# Patient Record
Sex: Male | Born: 1965 | Race: Black or African American | Hispanic: No | Marital: Married | Smoking: Current every day smoker
Health system: Southern US, Community
[De-identification: ages and names within clinical notes are randomized; demographics above are authoritative.]

## PROBLEM LIST (undated history)

## (undated) DIAGNOSIS — E785 Hyperlipidemia, unspecified: Secondary | ICD-10-CM

## (undated) DIAGNOSIS — M542 Cervicalgia: Secondary | ICD-10-CM

## (undated) DIAGNOSIS — M549 Dorsalgia, unspecified: Secondary | ICD-10-CM

## (undated) DIAGNOSIS — M79606 Pain in leg, unspecified: Secondary | ICD-10-CM

## (undated) DIAGNOSIS — J45909 Unspecified asthma, uncomplicated: Secondary | ICD-10-CM

## (undated) DIAGNOSIS — I10 Essential (primary) hypertension: Secondary | ICD-10-CM

## (undated) DIAGNOSIS — E119 Type 2 diabetes mellitus without complications: Secondary | ICD-10-CM

## (undated) DIAGNOSIS — R200 Anesthesia of skin: Secondary | ICD-10-CM

## (undated) HISTORY — DX: Anesthesia of skin: R20.0

## (undated) HISTORY — PX: CERVICAL SPINE SURGERY: SHX589

## (undated) HISTORY — DX: Cervicalgia: M54.2

## (undated) HISTORY — PX: BACK SURGERY: SHX140

## (undated) HISTORY — PX: WRIST SURGERY: SHX841

## (undated) HISTORY — PX: SHOULDER SURGERY: SHX246

## (undated) HISTORY — DX: Essential (primary) hypertension: I10

## (undated) HISTORY — DX: Type 2 diabetes mellitus without complications: E11.9

## (undated) HISTORY — DX: Unspecified asthma, uncomplicated: J45.909

## (undated) HISTORY — DX: Pain in leg, unspecified: M79.606

## (undated) HISTORY — DX: Dorsalgia, unspecified: M54.9

## (undated) HISTORY — DX: Hyperlipidemia, unspecified: E78.5

---

## 1994-11-23 HISTORY — PX: BACK SURGERY: SHX140

## 2001-09-23 HISTORY — PX: WRIST SURGERY: SHX841

## 2002-08-24 HISTORY — PX: SHOULDER SURGERY: SHX246

## 2009-12-24 HISTORY — PX: CERVICAL SPINE SURGERY: SHX589

## 2016-12-24 HISTORY — PX: NECK SURGERY: SHX720

## 2018-11-10 LAB — BASIC METABOLIC PANEL
BUN: 13 (ref 4–21)
Creatinine: 0.9 (ref 0.6–1.3)

## 2018-11-10 LAB — LIPID PANEL
Cholesterol: 231 — AB (ref 0–200)
HDL: 42 (ref 35–70)
LDL Cholesterol: 140
Triglycerides: 247 — AB (ref 40–160)

## 2018-11-10 LAB — HEMOGLOBIN A1C: Hemoglobin A1C: 10.1

## 2019-01-15 ENCOUNTER — Ambulatory Visit: Payer: Self-pay | Admitting: "Endocrinology

## 2019-02-04 ENCOUNTER — Ambulatory Visit: Payer: BLUE CROSS/BLUE SHIELD | Admitting: "Endocrinology

## 2019-02-04 ENCOUNTER — Encounter: Payer: Self-pay | Admitting: "Endocrinology

## 2019-02-04 VITALS — BP 132/88 | HR 90 | Ht 73.0 in | Wt 270.0 lb

## 2019-02-04 DIAGNOSIS — E782 Mixed hyperlipidemia: Secondary | ICD-10-CM | POA: Diagnosis not present

## 2019-02-04 DIAGNOSIS — E1165 Type 2 diabetes mellitus with hyperglycemia: Secondary | ICD-10-CM

## 2019-02-04 DIAGNOSIS — I10 Essential (primary) hypertension: Secondary | ICD-10-CM

## 2019-02-04 MED ORDER — ACCU-CHEK GUIDE ME W/DEVICE KIT: 1.0000 | PACK | 0 refills | Status: AC

## 2019-02-04 MED ORDER — GLUCOSE BLOOD VI STRP: ORAL_STRIP | 2 refills | Status: AC

## 2019-02-04 MED ORDER — ATORVASTATIN CALCIUM 40 MG PO TABS: 40.0000 mg | ORAL_TABLET | Freq: Every day | ORAL | 3 refills | Status: DC

## 2019-02-04 NOTE — Progress Notes (Signed)
Endocrinology Consult Note       02/04/2019, 6:40 PM   Subjective:    Patient ID: Lee Turner, male    DOB: 06/28/1966.  Lee Turner is being seen in consultation for management of currently uncontrolled symptomatic diabetes requested by  Narda Bonds, FNP.   Past Medical History:  Diagnosis Date  . Diabetes mellitus, type II ([place])   . Hyperlipidemia   . Hypertension    Past Surgical History:  Procedure Laterality Date  . BACK SURGERY    . CERVICAL SPINE SURGERY    . SHOULDER SURGERY    . WRIST SURGERY     Social History   Socioeconomic History  . Marital status: Married    Spouse name: Not on file  . Number of children: Not on file  . Years of education: Not on file  . Highest education level: Not on file  Occupational History  . Not on file  Social Needs  . Financial resource strain: Not on file  . Food insecurity:    Worry: Not on file    Inability: Not on file  . Transportation needs:    Medical: Not on file    Non-medical: Not on file  Tobacco Use  . Smoking status: Current Every Day Smoker    Packs/day: 0.50    Years: 30.00    Pack years: 15.00    Types: Cigarettes  . Smokeless tobacco: Never Used  Substance and Sexual Activity  . Alcohol use: Yes    Comment: socially  . Drug use: Never  . Sexual activity: Not on file  Lifestyle  . Physical activity:    Days per week: Not on file    Minutes per session: Not on file  . Stress: Not on file  Relationships  . Social connections:    Talks on phone: Not on file    Gets together: Not on file    Attends religious service: Not on file    Active member of club or organization: Not on file    Attends meetings of clubs or organizations: Not on file    Relationship status: Not on file  Other Topics Concern  . Not on file  Social History Narrative  . Not on file   Outpatient Encounter Medications as of  02/04/2019  Medication Sig  . desloratadine (CLARINEX) 5 MG tablet Take 5 mg by mouth daily.  . diclofenac (VOLTAREN) 75 MG EC tablet Take 75 mg by mouth 2 (two) times daily.  . fenofibrate (TRICOR) 48 MG tablet Take 48 mg by mouth daily.  . Fluticasone-Salmeterol (ADVAIR) 250-50 MCG/DOSE AEPB Inhale 1 puff into the lungs 2 (two) times daily.  Marland Kitchen HYDROcodone-acetaminophen (NORCO) 7.5-325 MG tablet Take 1 tablet by mouth 2 (two) times daily as needed.  . Ipratropium-Albuterol (COMBIVENT IN) Inhale into the lungs 2 (two) times daily as needed.  . montelukast (SINGULAIR) 10 MG tablet Take 10 mg by mouth at bedtime.  . pregabalin (LYRICA) 100 MG capsule Take 100 mg by mouth 2 (two) times daily.  . SitaGLIPtin-MetFORMIN HCl 50-1000 MG TB24 Take 1 tablet by mouth daily with breakfast.  .  verapamil (CALAN-SR) 240 MG CR tablet Take 240 mg by mouth at bedtime.  Marland Kitchen atorvastatin (LIPITOR) 40 MG tablet Take 1 tablet (40 mg total) by mouth at bedtime.  . Blood Glucose Monitoring Suppl (ACCU-CHEK GUIDE ME) w/Device KIT 1 Piece by Does not apply route as directed.  . cyclobenzaprine (FLEXERIL) 10 MG tablet 2 (two) times daily.  Marland Kitchen glucose blood (ACCU-CHEK GUIDE) test strip Use as instructed   No facility-administered encounter medications on file as of 02/04/2019.     ALLERGIES: No Known Allergies  VACCINATION STATUS:  There is no immunization history on file for this patient.  Diabetes  He presents for his initial diabetic visit. He has type 2 diabetes mellitus. Onset time: He was diagnosed at approximate age of 42 years. His disease course has been worsening. There are no hypoglycemic associated symptoms. Pertinent negatives for hypoglycemia include no confusion, headaches, pallor or seizures. Associated symptoms include polydipsia and polyuria. Pertinent negatives for diabetes include no chest pain, no fatigue, no polyphagia and no weakness. There are no hypoglycemic complications. Symptoms are worsening.  There are no diabetic complications. Risk factors for coronary artery disease include diabetes mellitus, dyslipidemia, family history, male sex, obesity, hypertension, sedentary lifestyle and tobacco exposure. Current diabetic treatment includes oral agent (monotherapy). His weight is increasing steadily. He is following a generally unhealthy diet. When asked about meal planning, he reported none. He has not had a previous visit with a dietitian. He participates in exercise intermittently. (He did not bring any meter nor logs to review, his recent a1c was 10.1% on 11/10/2018.)  Hyperlipidemia  This is a chronic problem. The current episode started more than 1 year ago. The problem is uncontrolled. Pertinent negatives include no chest pain, myalgias or shortness of breath. Current antihyperlipidemic treatment includes fibric acid derivatives. Risk factors for coronary artery disease include diabetes mellitus, dyslipidemia, hypertension, male sex, a sedentary lifestyle, obesity and family history.  Hypertension  This is a chronic problem. The problem is controlled. Pertinent negatives include no chest pain, headaches, neck pain, palpitations or shortness of breath. Past treatments include calcium channel blockers and diuretics.      Review of Systems  Constitutional: Negative for chills, fatigue, fever and unexpected weight change.  HENT: Negative for dental problem, mouth sores and trouble swallowing.   Eyes: Negative for visual disturbance.  Respiratory: Negative for cough, choking, chest tightness, shortness of breath and wheezing.   Cardiovascular: Negative for chest pain, palpitations and leg swelling.  Gastrointestinal: Negative for abdominal distention, abdominal pain, constipation, diarrhea, nausea and vomiting.  Endocrine: Positive for polydipsia and polyuria. Negative for polyphagia.  Genitourinary: Negative for dysuria, flank pain, hematuria and urgency.  Musculoskeletal: Negative for back  pain, gait problem, myalgias and neck pain.  Skin: Negative for pallor, rash and wound.  Neurological: Negative for seizures, syncope, weakness, numbness and headaches.  Psychiatric/Behavioral: Negative for confusion and dysphoric mood.    Objective:    BP 132/88   Pulse 90   Ht '6\' 1"'  (1.854 m)   Wt 270 lb (122.5 kg)   BMI 35.62 kg/m   Wt Readings from Last 3 Encounters:  02/04/19 270 lb (122.5 kg)     Physical Exam Constitutional:      General: He is not in acute distress.    Appearance: He is well-developed.  HENT:     Head: Normocephalic and atraumatic.  Neck:     Musculoskeletal: Normal range of motion and neck supple.     Thyroid: No thyromegaly.  Trachea: No tracheal deviation.  Cardiovascular:     Rate and Rhythm: Normal rate.     Pulses:          Dorsalis pedis pulses are 1+ on the right side and 1+ on the left side.       Posterior tibial pulses are 1+ on the right side and 1+ on the left side.     Heart sounds: Normal heart sounds, S1 normal and S2 normal. No murmur. No gallop.   Pulmonary:     Effort: No respiratory distress.     Breath sounds: Normal breath sounds. No wheezing.  Abdominal:     General: There is no distension.     Tenderness: There is no abdominal tenderness. There is no guarding.  Musculoskeletal:     Right shoulder: He exhibits no swelling and no deformity.  Skin:    General: Skin is warm and dry.     Findings: No rash.     Nails: There is no clubbing.   Neurological:     Mental Status: He is alert and oriented to person, place, and time.     Cranial Nerves: No cranial nerve deficit.     Sensory: No sensory deficit.     Gait: Gait normal.     Deep Tendon Reflexes: Reflexes are normal and symmetric.  Psychiatric:        Speech: Speech normal.        Behavior: Behavior normal. Behavior is cooperative.        Thought Content: Thought content normal.        Judgment: Judgment normal.    Recent Results (from the past 2160  hour(s))  Basic metabolic panel     Status: None   Collection Time: 11/10/18 12:00 AM  Result Value Ref Range   BUN 13 4 - 21   Creatinine 0.9 0.6 - 1.3  Lipid panel     Status: Abnormal   Collection Time: 11/10/18 12:00 AM  Result Value Ref Range   Triglycerides 247 (A) 40 - 160   Cholesterol 231 (A) 0 - 200   HDL 42 35 - 70   LDL Cholesterol 140   Hemoglobin A1c     Status: None   Collection Time: 11/10/18 12:00 AM  Result Value Ref Range   Hemoglobin A1C 10.1      Assessment & Plan:   1. Uncontrolled type 2 diabetes mellitus with hyperglycemia (HCC)   - Lee Turner has currently uncontrolled symptomatic type 2 DM since  53 years of age,  with most recent A1c of 10.1 %. Recent labs reviewed. - I had a long discussion with him about the progressive nature of diabetes and the pathology behind its complications. -his diabetes is complicated by obesity/sedentary life and he remains at a high risk for more acute and chronic complications which include CAD, CVA, CKD, retinopathy, and neuropathy. These are all discussed in detail with him.  - I have counseled him on diet management and weight loss, by adopting a carbohydrate restricted/protein rich diet. - he admits that there is a room for improvement in his food and drink choices. - Suggestion is made for him to avoid simple carbohydrates  from his diet including Cakes, Sweet Desserts, Ice Cream, Soda (diet and regular), Sweet Tea, Candies, Chips, Cookies, Store Bought Juices, Alcohol in Excess of  1-2 drinks a day, Artificial Sweeteners,  Coffee Creamer, and "Sugar-free" Products. This will help patient to have more stable blood glucose profile and potentially  avoid unintended weight gain.  - I encouraged him to switch to  unprocessed or minimally processed complex starch and increased protein intake (animal or plant source), fruits, and vegetables.  - he is advised to stick to a routine mealtimes to eat 3 meals  a day and avoid  unnecessary snacks ( to snack only to correct hypoglycemia).   - he will be scheduled with Jearld Fenton, RDN, CDE for individualized diabetes education.  - I have approached him with the following individualized plan to manage diabetes and patient agrees:   - he will likely require insulin therapy in order for him to achieve and maintain control of diabetes to target. -In preparation he is approached for  strict monitoring of glucose 4 times a day-before meals and at bedtime and present his logs, meter, and his new labs in 1 week.  - he is encouraged to call clinic for blood glucose levels less than 70 or above 300 mg /dl. - he is advised to continue his Janumet at 50/1000mg ER once a day at breakfast, therapeutically suitable for patient .  - Patient specific target  A1c;  LDL, HDL, Triglycerides, and  Waist Circumference were discussed in detail.  2) Blood Pressure /Hypertension:  his blood pressure is  controlled to target.   he is advised to continue his current medications including  Verapamil/HCTZ  p.o. daily with breakfast . 3) Lipids/Hyperlipidemia:   Review of his recent lipid panel showed uncontrolled  LDL at 140 .  he  is advised to continue tricore, but added atorvastatin 31m po qhs.   4)  Weight/Diet:  Body mass index is 35.62 kg/m.  -   clearly complicating his diabetes care.  I discussed with him the fact that loss of 5 - 10% of his  current body weight will have the most impact on his diabetes management.  CDE Consult will be initiated . Exercise, and detailed carbohydrates information provided  -  detailed on discharge instructions.  5) Chronic Care/Health Maintenance:  -he  Is statin medications and  is encouraged to initiate and continue to follow up with Ophthalmology, Dentist,  Podiatrist at least yearly or according to recommendations, and advised to  quit smoking. I have recommended yearly flu vaccine and pneumonia vaccine at least every 5 years; moderate intensity  exercise for up to 150 minutes weekly; and  sleep for at least 7 hours a day.  - he is  advised to maintain close follow up with ANarda Bonds FNP for primary care needs, as well as his other providers for optimal and coordinated care.  - Time spent with the patient: 35 minutes, of which >50% was spent in obtaining information about his symptoms, reviewing his previous labs/studies, evaluations, and treatments, counseling him about his  Uncontrolled T2DM, HPL, HTN , and developing plans for long term treatment based on the latest standards of care/guidelines.    GAzucena Kubaparticipated in the discussions, expressed understanding, and voiced agreement with the above plans.  All questions were answered to his satisfaction. he is encouraged to contact clinic should he have any questions or concerns prior to his return visit.  Follow up plan: - Return in about 1 week (around 02/11/2019), or Must Bring His Meter and Logs next Visit, for Follow up with Pre-visit Labs, Meter, and Logs, Labs Today- Non-Fasting Ok.  Lee Lloyd MD [facility] RW[facility] Hospital[address] StreetRFarmersville [place] [address]Phone: [phone] Fax: [phone]  02/04/2019, 6:40 PM  This note was partially dictated with voice recognition software. Similar sounding words can be transcribed inadequately or may not  be corrected upon review.

## 2019-02-04 NOTE — Patient Instructions (Signed)

## 2019-02-05 LAB — COMPLETE METABOLIC PANEL WITH GFR
AG Ratio: 1.5 (calc) (ref 1.0–2.5)
ALT: 34 U/L (ref 9–46)
AST: 17 U/L (ref 10–35)
Albumin: 4.5 g/dL (ref 3.6–5.1)
Alkaline phosphatase (APISO): 52 U/L (ref 35–144)
BILIRUBIN TOTAL: 0.4 mg/dL (ref 0.2–1.2)
BUN: 13 mg/dL (ref 7–25)
CO2: 25 mmol/L (ref 20–32)
Calcium: 10 mg/dL (ref 8.6–10.3)
Chloride: 102 mmol/L (ref 98–110)
Creat: 0.87 mg/dL (ref 0.70–1.33)
GFR, Est African American: 113 mL/min/{1.73_m2} (ref >=60)
GFR, Est Non African American: 98 mL/min/{1.73_m2} (ref >=60)
Globulin: 3 g/dL (calc) (ref 1.9–3.7)
Glucose, Bld: 113 mg/dL (ref 65–139)
Potassium: 4.6 mmol/L (ref 3.5–5.3)
SODIUM: 136 mmol/L (ref 135–146)
Total Protein: 7.5 g/dL (ref 6.1–8.1)

## 2019-02-05 LAB — HEMOGLOBIN A1C
Hgb A1c MFr Bld: 9.1 % of total Hgb — ABNORMAL HIGH (ref <5.7)
Mean Plasma Glucose: 214 (calc)
eAG (mmol/L): 11.9 (calc)

## 2019-02-05 LAB — MICROALBUMIN / CREATININE URINE RATIO
Creatinine, Urine: 125 mg/dL (ref 20–320)
MICROALB UR: 0.3 mg/dL
Microalb Creat Ratio: 2 mcg/mg creat (ref <30)

## 2019-02-05 LAB — T4, FREE: Free T4: 1.2 ng/dL (ref 0.8–1.8)

## 2019-02-05 LAB — TSH: TSH: 1.11 mIU/L (ref 0.40–4.50)

## 2019-02-05 LAB — VITAMIN D 25 HYDROXY (VIT D DEFICIENCY, FRACTURES): Vit D, 25-Hydroxy: 9 ng/mL — ABNORMAL LOW (ref 30–100)

## 2019-02-13 ENCOUNTER — Ambulatory Visit: Payer: BLUE CROSS/BLUE SHIELD | Admitting: "Endocrinology

## 2019-02-13 ENCOUNTER — Encounter: Payer: Self-pay | Admitting: "Endocrinology

## 2019-02-13 VITALS — BP 125/82 | HR 91 | Ht 73.0 in | Wt 273.0 lb

## 2019-02-13 DIAGNOSIS — E782 Mixed hyperlipidemia: Secondary | ICD-10-CM | POA: Diagnosis not present

## 2019-02-13 DIAGNOSIS — E1165 Type 2 diabetes mellitus with hyperglycemia: Secondary | ICD-10-CM | POA: Diagnosis not present

## 2019-02-13 DIAGNOSIS — I10 Essential (primary) hypertension: Secondary | ICD-10-CM | POA: Diagnosis not present

## 2019-02-13 DIAGNOSIS — E559 Vitamin D deficiency, unspecified: Secondary | ICD-10-CM

## 2019-02-13 MED ORDER — VITAMIN D3 125 MCG (5000 UT) PO CAPS: 5000.0000 [IU] | ORAL_CAPSULE | Freq: Every day | ORAL | 0 refills | Status: DC

## 2019-02-13 NOTE — Progress Notes (Signed)
Endocrinology follow-up  Note       02/13/2019, 11:50 AM   Subjective:    Patient ID: Lee Turner, male    DOB: 10/25/1966.  Azucena Kuba is being seen in follow-up  for management of currently uncontrolled symptomatic diabetes requested by  Narda Bonds, FNP.   Past Medical History:  Diagnosis Date  . Diabetes mellitus, type II ([place])   . Hyperlipidemia   . Hypertension    Past Surgical History:  Procedure Laterality Date  . BACK SURGERY    . CERVICAL SPINE SURGERY    . SHOULDER SURGERY    . WRIST SURGERY     Social History   Socioeconomic History  . Marital status: Married    Spouse name: Not on file  . Number of children: Not on file  . Years of education: Not on file  . Highest education level: Not on file  Occupational History  . Not on file  Social Needs  . Financial resource strain: Not on file  . Food insecurity:    Worry: Not on file    Inability: Not on file  . Transportation needs:    Medical: Not on file    Non-medical: Not on file  Tobacco Use  . Smoking status: Current Every Day Smoker    Packs/day: 0.50    Years: 30.00    Pack years: 15.00    Types: Cigarettes  . Smokeless tobacco: Never Used  Substance and Sexual Activity  . Alcohol use: Yes    Comment: socially  . Drug use: Never  . Sexual activity: Not on file  Lifestyle  . Physical activity:    Days per week: Not on file    Minutes per session: Not on file  . Stress: Not on file  Relationships  . Social connections:    Talks on phone: Not on file    Gets together: Not on file    Attends religious service: Not on file    Active member of club or organization: Not on file    Attends meetings of clubs or organizations: Not on file    Relationship status: Not on file  Other Topics Concern  . Not on file  Social History Narrative  . Not on file   Outpatient Encounter Medications as of  02/13/2019  Medication Sig  . atorvastatin (LIPITOR) 40 MG tablet Take 1 tablet (40 mg total) by mouth at bedtime.  . Blood Glucose Monitoring Suppl (ACCU-CHEK GUIDE ME) w/Device KIT 1 Piece by Does not apply route as directed.  . Cholecalciferol (VITAMIN D3) 125 MCG (5000 UT) CAPS Take 1 capsule (5,000 Units total) by mouth daily.  . cyclobenzaprine (FLEXERIL) 10 MG tablet 2 (two) times daily.  Marland Kitchen desloratadine (CLARINEX) 5 MG tablet Take 5 mg by mouth daily.  . diclofenac (VOLTAREN) 75 MG EC tablet Take 75 mg by mouth 2 (two) times daily.  . fenofibrate (TRICOR) 48 MG tablet Take 48 mg by mouth daily.  . Fluticasone-Salmeterol (ADVAIR) 250-50 MCG/DOSE AEPB Inhale 1 puff into the lungs 2 (two) times daily.  Marland Kitchen glucose blood (ACCU-CHEK GUIDE) test strip Use as  instructed  . HYDROcodone-acetaminophen (NORCO) 7.5-325 MG tablet Take 1 tablet by mouth 2 (two) times daily as needed.  . Ipratropium-Albuterol (COMBIVENT IN) Inhale into the lungs 2 (two) times daily as needed.  . montelukast (SINGULAIR) 10 MG tablet Take 10 mg by mouth at bedtime.  . pregabalin (LYRICA) 100 MG capsule Take 100 mg by mouth 2 (two) times daily.  . SitaGLIPtin-MetFORMIN HCl 50-1000 MG TB24 Take 1 tablet by mouth daily with breakfast.  . verapamil (CALAN-SR) 240 MG CR tablet Take 240 mg by mouth at bedtime.   No facility-administered encounter medications on file as of 02/13/2019.     ALLERGIES: No Known Allergies  VACCINATION STATUS:  There is no immunization history on file for this patient.  Diabetes  He presents for his follow-up diabetic visit. He has type 2 diabetes mellitus. Onset time: He was diagnosed at approximate age of 53 years. His disease course has been improving. There are no hypoglycemic associated symptoms. Pertinent negatives for hypoglycemia include no confusion, headaches, pallor or seizures. Pertinent negatives for diabetes include no chest pain, no fatigue, no polydipsia, no polyphagia, no  polyuria and no weakness. There are no hypoglycemic complications. Symptoms are improving. There are no diabetic complications. Risk factors for coronary artery disease include diabetes mellitus, dyslipidemia, family history, male sex, obesity, hypertension, sedentary lifestyle and tobacco exposure. Current diabetic treatment includes oral agent (monotherapy). His weight is stable. He is following a generally unhealthy diet. When asked about meal planning, he reported none. He has not had a previous visit with a dietitian. He participates in exercise intermittently. His breakfast blood glucose range is generally 130-140 mg/dl. His lunch blood glucose range is generally 110-130 mg/dl. His dinner blood glucose range is generally 110-130 mg/dl. His overall blood glucose range is 110-130 mg/dl. (He returns with significantly improved glycemic profile, averaging 125 over the last 7 days.  His most recent labs show A1c of 9.1% improving from 10.1%.    )  Hyperlipidemia  This is a chronic problem. The current episode started more than 1 year ago. The problem is uncontrolled. Pertinent negatives include no chest pain, myalgias or shortness of breath. Current antihyperlipidemic treatment includes fibric acid derivatives. Risk factors for coronary artery disease include diabetes mellitus, dyslipidemia, hypertension, male sex, a sedentary lifestyle, obesity and family history.  Hypertension  This is a chronic problem. The problem is controlled. Pertinent negatives include no chest pain, headaches, neck pain, palpitations or shortness of breath. Past treatments include calcium channel blockers and diuretics.     Review of Systems  Constitutional: Negative for chills, fatigue, fever and unexpected weight change.  HENT: Negative for dental problem, mouth sores and trouble swallowing.   Eyes: Negative for visual disturbance.  Respiratory: Negative for cough, choking, chest tightness, shortness of breath and wheezing.    Cardiovascular: Negative for chest pain, palpitations and leg swelling.  Gastrointestinal: Negative for abdominal distention, abdominal pain, constipation, diarrhea, nausea and vomiting.  Endocrine: Negative for polydipsia, polyphagia and polyuria.  Genitourinary: Negative for dysuria, flank pain, hematuria and urgency.  Musculoskeletal: Negative for back pain, gait problem, myalgias and neck pain.  Skin: Negative for pallor, rash and wound.  Neurological: Negative for seizures, syncope, weakness, numbness and headaches.  Psychiatric/Behavioral: Negative for confusion and dysphoric mood.    Objective:    BP 125/82   Pulse 91   Ht '6\' 1"'  (1.854 m)   Wt 273 lb (123.8 kg)   BMI 36.02 kg/m   Wt Readings from Last 3 Encounters:  02/13/19 273 lb (123.8 kg)  02/04/19 270 lb (122.5 kg)     Physical Exam Constitutional:      General: He is not in acute distress.    Appearance: He is well-developed.  HENT:     Head: Normocephalic and atraumatic.  Neck:     Musculoskeletal: Normal range of motion and neck supple.     Thyroid: No thyromegaly.     Trachea: No tracheal deviation.  Cardiovascular:     Rate and Rhythm: Normal rate.     Pulses:          Dorsalis pedis pulses are 1+ on the right side and 1+ on the left side.       Posterior tibial pulses are 1+ on the right side and 1+ on the left side.     Heart sounds: Normal heart sounds, S1 normal and S2 normal. No murmur. No gallop.   Pulmonary:     Effort: No respiratory distress.     Breath sounds: Normal breath sounds. No wheezing.  Abdominal:     General: There is no distension.     Tenderness: There is no abdominal tenderness. There is no guarding.  Musculoskeletal:     Right shoulder: He exhibits no swelling and no deformity.  Skin:    General: Skin is warm and dry.     Findings: No rash.     Nails: There is no clubbing.   Neurological:     Mental Status: He is alert and oriented to person, place, and time.      Cranial Nerves: No cranial nerve deficit.     Sensory: No sensory deficit.     Gait: Gait normal.     Deep Tendon Reflexes: Reflexes are normal and symmetric.  Psychiatric:        Speech: Speech normal.        Behavior: Behavior normal. Behavior is cooperative.        Thought Content: Thought content normal.        Judgment: Judgment normal.    Recent Results (from the past 2160 hour(s))  Hemoglobin A1c     Status: Abnormal   Collection Time: 02/04/19  4:28 PM  Result Value Ref Range   Hgb A1c MFr Bld 9.1 (H) <5.7 % of total Hgb    Comment: For someone without known diabetes, a hemoglobin A1c value of 6.5% or greater indicates that they may have  diabetes and this should be confirmed with a follow-up  test. . For someone with known diabetes, a value <7% indicates  that their diabetes is well controlled and a value  greater than or equal to 7% indicates suboptimal  control. A1c targets should be individualized based on  duration of diabetes, age, comorbid conditions, and  other considerations. . Currently, no consensus exists regarding use of hemoglobin A1c for diagnosis of diabetes for children. .    Mean Plasma Glucose 214 (calc)   eAG (mmol/L) 11.9 (calc)  COMPLETE METABOLIC PANEL WITH GFR     Status: None   Collection Time: 02/04/19  4:28 PM  Result Value Ref Range   Glucose, Bld 113 65 - 139 mg/dL    Comment: .        Non-fasting reference interval .    BUN 13 7 - 25 mg/dL   Creat 0.87 0.70 - 1.33 mg/dL    Comment: For patients >16 years of age, the reference limit for Creatinine is approximately 13% higher for people identified as African-American. Marland Kitchen  GFR, Est Non African American 98 > OR = 60 mL/min/1.44m   GFR, Est African American 113 > OR = 60 mL/min/1.786m  BUN/Creatinine Ratio NOT APPLICABLE 6 - 22 (calc)   Sodium 136 135 - 146 mmol/L   Potassium 4.6 3.5 - 5.3 mmol/L   Chloride 102 98 - 110 mmol/L   CO2 25 20 - 32 mmol/L   Calcium 10.0 8.6 - 10.3  mg/dL   Total Protein 7.5 6.1 - 8.1 g/dL   Albumin 4.5 3.6 - 5.1 g/dL   Globulin 3.0 1.9 - 3.7 g/dL (calc)   AG Ratio 1.5 1.0 - 2.5 (calc)   Total Bilirubin 0.4 0.2 - 1.2 mg/dL   Alkaline phosphatase (APISO) 52 35 - 144 U/L   AST 17 10 - 35 U/L   ALT 34 9 - 46 U/L  Microalbumin / creatinine urine ratio     Status: None   Collection Time: 02/04/19  4:28 PM  Result Value Ref Range   Creatinine, Urine 125 20 - 320 mg/dL   Microalb, Ur 0.3 mg/dL    Comment: Reference Range Not established    Microalb Creat Ratio 2 <30 mcg/mg creat    Comment: . The ADA defines abnormalities in albumin excretion as follows: . Marland Kitchenategory         Result (mcg/mg creatinine) . Normal                    <30 Microalbuminuria         30-299  Clinical albuminuria   > OR = 300 . The ADA recommends that at least two of three specimens collected within a 3-6 month period be abnormal before considering a patient to be within a diagnostic category.   TSH     Status: None   Collection Time: 02/04/19  4:28 PM  Result Value Ref Range   TSH 1.11 0.40 - 4.50 mIU/L  T4, free     Status: None   Collection Time: 02/04/19  4:28 PM  Result Value Ref Range   Free T4 1.2 0.8 - 1.8 ng/dL  VITAMIN D 25 Hydroxy (Vit-D Deficiency, Fractures)     Status: Abnormal   Collection Time: 02/04/19  4:28 PM  Result Value Ref Range   Vit D, 25-Hydroxy 9 (L) 30 - 100 ng/mL    Comment: Vitamin D Status         25-OH Vitamin D: . Deficiency:                    <20 ng/mL Insufficiency:             20 - 29 ng/mL Optimal:                 > or = 30 ng/mL . For 25-OH Vitamin D testing on patients on  D2-supplementation and patients for whom quantitation  of D2 and D3 fractions is required, the QuestAssureD(TM) 25-OH VIT D, (D2,D3), LC/MS/MS is recommended: order  code [id number] >2y35yr . For more information on this test, go to: http://education.questdiagnostics.com/faq/FAQ163 (This link is being provided for   informational/educational purposes only.)      Assessment & Plan:   1. Uncontrolled type 2 diabetes mellitus with hyperglycemia (HCC)   - GreAzucena Kubas currently uncontrolled symptomatic type 2 DM since  [age] of age. -His recent labs show A1c of 9.1% improving from 10.1%.  He presents with significantly improved glycemic profile averaging 125 over the last 7  days.  - Recent labs reviewed.  -his diabetes is complicated by obesity/sedentary life and he remains at a high risk for more acute and chronic complications which include CAD, CVA, CKD, retinopathy, and neuropathy. These are all discussed in detail with him.  - I have counseled him on diet management and weight loss, by adopting a carbohydrate restricted/protein rich diet.  - Patient admits there is a room for improvement in his diet and drink choices. -  Suggestion is made for him to avoid simple carbohydrates  from his diet including Cakes, Sweet Desserts / Pastries, Ice Cream, Soda (diet and regular), Sweet Tea, Candies, Chips, Cookies, Store Bought Juices, Alcohol in Excess of  1-2 drinks a day, Artificial Sweeteners, and "Sugar-free" Products. This will help patient to have stable blood glucose profile and potentially avoid unintended weight gain.   - I encouraged him to switch to  unprocessed or minimally processed complex starch and increased protein intake (animal or plant source), fruits, and vegetables.  - he is advised to stick to a routine mealtimes to eat 3 meals  a day and avoid unnecessary snacks ( to snack only to correct hypoglycemia).   - he will be scheduled with Jearld Fenton, RDN, CDE for individualized diabetes education.  - I have approached him with the following individualized plan to manage diabetes and patient agrees:   -Based on his presentation with near target glycemia, he will not require insulin treatment at this time. -He is advised to continue Janumet 50/1000 mg ER once a day at  breakfast, continue to monitor blood glucose at least one time a day before breakfast.  - Patient specific target  A1c;  LDL, HDL, Triglycerides, and  Waist Circumference were discussed in detail.  2) Blood Pressure /Hypertension:  his blood pressure is controlled to target.    he is advised to continue his current medications including  Verapamil/HCTZ  p.o. daily with breakfast .   3) Lipids/Hyperlipidemia:   Review of his recent lipid panel showed uncontrolled  LDL at 140 .  he  is advised to continue tricore, but added atorvastatin 30m po qhs.   4)  Weight/Diet:  Body mass index is 36.02 kg/m.  -   clearly complicating his diabetes care.  I discussed with him the fact that loss of 5 - 10% of his  current body weight will have the most impact on his diabetes management.  CDE Consult will be initiated . Exercise, and detailed carbohydrates information provided  -  detailed on discharge instructions.  5) Chronic Care/Health Maintenance:  -he  Is statin medications and  is encouraged to initiate and continue to follow up with Ophthalmology, Dentist,  Podiatrist at least yearly or according to recommendations, and advised to  quit smoking. I have recommended yearly flu vaccine and pneumonia vaccine at least every 5 years; moderate intensity exercise for up to 150 minutes weekly; and  sleep for at least 7 hours a day.  - he is  advised to maintain close follow up with ANarda Bonds FNP for primary care needs, as well as his other providers for optimal and coordinated care.  - Time spent with the patient: 25 min, of which >50% was spent in reviewing his blood glucose logs , discussing his hypoglycemia and hyperglycemia episodes, reviewing his current and  previous labs / studies and medications  doses and developing a plan to avoid hypoglycemia and hyperglycemia. Please refer to Patient Instructions for Blood Glucose Monitoring and Insulin/Medications Dosing Guide"  in media tab for  additional information. Azucena Kuba participated in the discussions, expressed understanding, and voiced agreement with the above plans.  All questions were answered to his satisfaction. he is encouraged to contact clinic should he have any questions or concerns prior to his return visit.   Follow up plan: - Return in about 4 months (around 06/14/2019) for Follow up with Pre-visit Labs, Meter, and Logs.  Glade Lloyd, MD [facility] [address] [place], [place] [address] Phone: [phone]  Fax: [phone]    02/13/2019, 11:50 AM  This note was partially dictated with voice recognition software. Similar sounding words can be transcribed inadequately or may not  be corrected upon review.

## 2019-02-13 NOTE — Patient Instructions (Signed)

## 2019-03-11 ENCOUNTER — Ambulatory Visit: Payer: Self-pay | Admitting: Nutrition

## 2019-06-09 ENCOUNTER — Other Ambulatory Visit: Payer: Self-pay | Admitting: "Endocrinology

## 2019-06-17 ENCOUNTER — Other Ambulatory Visit: Payer: Self-pay

## 2019-06-17 ENCOUNTER — Ambulatory Visit: Payer: BLUE CROSS/BLUE SHIELD | Admitting: "Endocrinology

## 2019-10-05 ENCOUNTER — Other Ambulatory Visit: Payer: Self-pay | Admitting: "Endocrinology

## 2020-01-31 ENCOUNTER — Other Ambulatory Visit: Payer: Self-pay | Admitting: "Endocrinology

## 2020-02-22 HISTORY — PX: SHOULDER SURGERY: SHX246

## 2020-06-01 ENCOUNTER — Encounter: Payer: Self-pay | Admitting: "Endocrinology

## 2020-07-28 ENCOUNTER — Other Ambulatory Visit: Payer: Self-pay | Admitting: "Endocrinology

## 2020-11-01 ENCOUNTER — Other Ambulatory Visit: Payer: Self-pay

## 2020-11-01 DIAGNOSIS — I10 Essential (primary) hypertension: Secondary | ICD-10-CM

## 2020-11-01 DIAGNOSIS — E1165 Type 2 diabetes mellitus with hyperglycemia: Secondary | ICD-10-CM

## 2020-11-01 DIAGNOSIS — E782 Mixed hyperlipidemia: Secondary | ICD-10-CM

## 2020-11-01 DIAGNOSIS — E559 Vitamin D deficiency, unspecified: Secondary | ICD-10-CM

## 2022-01-24 DIAGNOSIS — Z0289 Encounter for other administrative examinations: Secondary | ICD-10-CM

## 2022-02-08 ENCOUNTER — Ambulatory Visit (INDEPENDENT_AMBULATORY_CARE_PROVIDER_SITE_OTHER): Payer: BC Managed Care – PPO | Admitting: Family Medicine

## 2022-02-08 ENCOUNTER — Other Ambulatory Visit: Payer: Self-pay

## 2022-02-08 ENCOUNTER — Encounter (INDEPENDENT_AMBULATORY_CARE_PROVIDER_SITE_OTHER): Payer: Self-pay | Admitting: Family Medicine

## 2022-02-08 VITALS — BP 129/81 | HR 108 | Temp 98.1°F | Ht 71.0 in | Wt 257.0 lb

## 2022-02-08 DIAGNOSIS — I152 Hypertension secondary to endocrine disorders: Secondary | ICD-10-CM

## 2022-02-08 DIAGNOSIS — E1159 Type 2 diabetes mellitus with other circulatory complications: Secondary | ICD-10-CM

## 2022-02-08 DIAGNOSIS — E559 Vitamin D deficiency, unspecified: Secondary | ICD-10-CM

## 2022-02-08 DIAGNOSIS — E785 Hyperlipidemia, unspecified: Secondary | ICD-10-CM

## 2022-02-08 DIAGNOSIS — E1169 Type 2 diabetes mellitus with other specified complication: Secondary | ICD-10-CM

## 2022-02-08 DIAGNOSIS — R5383 Other fatigue: Secondary | ICD-10-CM | POA: Diagnosis not present

## 2022-02-08 DIAGNOSIS — E66812 Obesity, class 2: Secondary | ICD-10-CM

## 2022-02-08 DIAGNOSIS — E1165 Type 2 diabetes mellitus with hyperglycemia: Secondary | ICD-10-CM | POA: Diagnosis not present

## 2022-02-08 DIAGNOSIS — Z6835 Body mass index (BMI) 35.0-35.9, adult: Secondary | ICD-10-CM

## 2022-02-08 DIAGNOSIS — R0602 Shortness of breath: Secondary | ICD-10-CM

## 2022-02-08 DIAGNOSIS — Z7985 Long-term (current) use of injectable non-insulin antidiabetic drugs: Secondary | ICD-10-CM

## 2022-02-08 DIAGNOSIS — Z1331 Encounter for screening for depression: Secondary | ICD-10-CM

## 2022-02-08 DIAGNOSIS — Z720 Tobacco use: Secondary | ICD-10-CM

## 2022-02-08 DIAGNOSIS — E669 Obesity, unspecified: Secondary | ICD-10-CM

## 2022-02-08 MED ORDER — OZEMPIC (0.25 OR 0.5 MG/DOSE) 2 MG/1.5ML ~~LOC~~ SOPN: 0.2500 mg | PEN_INJECTOR | SUBCUTANEOUS | 0 refills | Status: DC

## 2022-02-09 LAB — COMPREHENSIVE METABOLIC PANEL
ALT: 45 IU/L — ABNORMAL HIGH (ref 0–44)
AST: 28 IU/L (ref 0–40)
Albumin/Globulin Ratio: 1.8 (ref 1.2–2.2)
Albumin: 4.5 g/dL (ref 3.8–4.9)
Alkaline Phosphatase: 54 IU/L (ref 44–121)
BUN/Creatinine Ratio: 11 (ref 9–20)
BUN: 10 mg/dL (ref 6–24)
Bilirubin Total: 0.2 mg/dL (ref 0.0–1.2)
CO2: 23 mmol/L (ref 20–29)
Calcium: 9.9 mg/dL (ref 8.7–10.2)
Chloride: 98 mmol/L (ref 96–106)
Creatinine, Ser: 0.89 mg/dL (ref 0.76–1.27)
Globulin, Total: 2.5 g/dL (ref 1.5–4.5)
Glucose: 121 mg/dL — ABNORMAL HIGH (ref 70–99)
Potassium: 4.7 mmol/L (ref 3.5–5.2)
Sodium: 137 mmol/L (ref 134–144)
Total Protein: 7 g/dL (ref 6.0–8.5)
eGFR: 100 mL/min/{1.73_m2} (ref >59)

## 2022-02-09 LAB — CBC WITH DIFFERENTIAL/PLATELET
Basophils Absolute: 0 10*3/uL (ref 0.0–0.2)
Basos: 1 %
EOS (ABSOLUTE): 0.2 10*3/uL (ref 0.0–0.4)
Eos: 3 %
Hematocrit: 42.1 % (ref 37.5–51.0)
Hemoglobin: 14.5 g/dL (ref 13.0–17.7)
Immature Grans (Abs): 0 10*3/uL (ref 0.0–0.1)
Immature Granulocytes: 0 %
Lymphocytes Absolute: 1.9 10*3/uL (ref 0.7–3.1)
Lymphs: 40 %
MCH: 31.5 pg (ref 26.6–33.0)
MCHC: 34.4 g/dL (ref 31.5–35.7)
MCV: 92 fL (ref 79–97)
Monocytes Absolute: 0.6 10*3/uL (ref 0.1–0.9)
Monocytes: 13 %
Neutrophils Absolute: 2 10*3/uL (ref 1.4–7.0)
Neutrophils: 43 %
Platelets: 244 10*3/uL (ref 150–450)
RBC: 4.6 x10E6/uL (ref 4.14–5.80)
RDW: 13 % (ref 11.6–15.4)
WBC: 4.7 10*3/uL (ref 3.4–10.8)

## 2022-02-09 LAB — TSH: TSH: 1.2 u[IU]/mL (ref 0.450–4.500)

## 2022-02-09 LAB — T4, FREE: Free T4: 1 ng/dL (ref 0.82–1.77)

## 2022-02-09 LAB — VITAMIN B12: Vitamin B-12: 793 pg/mL (ref 232–1245)

## 2022-02-09 LAB — LIPID PANEL WITH LDL/HDL RATIO
Cholesterol, Total: 137 mg/dL (ref 100–199)
HDL: 45 mg/dL (ref >39)
LDL Chol Calc (NIH): 74 mg/dL (ref 0–99)
LDL/HDL Ratio: 1.6 ratio (ref 0.0–3.6)
Triglycerides: 99 mg/dL (ref 0–149)
VLDL Cholesterol Cal: 18 mg/dL (ref 5–40)

## 2022-02-09 LAB — HEMOGLOBIN A1C
Est. average glucose Bld gHb Est-mCnc: 194 mg/dL
Hgb A1c MFr Bld: 8.4 % — ABNORMAL HIGH (ref 4.8–5.6)

## 2022-02-09 LAB — VITAMIN D 25 HYDROXY (VIT D DEFICIENCY, FRACTURES): Vit D, 25-Hydroxy: 22.7 ng/mL — ABNORMAL LOW (ref 30.0–100.0)

## 2022-02-09 LAB — INSULIN, RANDOM: INSULIN: 18.6 u[IU]/mL (ref 2.6–24.9)

## 2022-02-09 LAB — T3: T3, Total: 107 ng/dL (ref 71–180)

## 2022-02-09 LAB — FOLATE: Folate: 11 ng/mL (ref >3.0)

## 2022-02-12 ENCOUNTER — Encounter (INDEPENDENT_AMBULATORY_CARE_PROVIDER_SITE_OTHER): Payer: Self-pay

## 2022-02-12 ENCOUNTER — Telehealth (INDEPENDENT_AMBULATORY_CARE_PROVIDER_SITE_OTHER): Payer: Self-pay | Admitting: Family Medicine

## 2022-02-12 NOTE — Telephone Encounter (Signed)
Patient called requesting a Prior Authorization for the Ozempic that Dr. Jeani Sow prescribed. Pt stated that the medication will not be filled by the pharmacy without a PA. Patient would like call back at [phone].

## 2022-02-12 NOTE — Telephone Encounter (Signed)
Patient sent mychart message this morning prior authorization for Ozempic approved.

## 2022-02-12 NOTE — Telephone Encounter (Signed)
Spoke w/ pt, made aware of approval-CAS

## 2022-02-12 NOTE — Progress Notes (Signed)
Dear Dr. Rolena Infante,   Thank you for referring Lee Turner to our clinic. The following note includes my evaluation and treatment recommendations.  Chief Complaint:   OBESITY Lee Turner (MR# [id number]) is a 56 y.o. male who presents for evaluation and treatment of obesity and related comorbidities. Current BMI is Body mass index is 35.84 kg/m. Lee Turner has been struggling with his weight for many years and has been unsuccessful in either losing weight, maintaining weight loss, or reaching his healthy weight goal.  Lee Turner is currently in the action stage of change and ready to dedicate time achieving and maintaining a healthier weight. Lee Turner is interested in becoming our patient and working on intensive lifestyle modifications including (but not limited to) diet and exercise for weight loss.  Referred by Dr. Rolena Infante. [name] previously tried Orvan Seen. He skips 1 meal a day. He may get something for breakfast- 2 biscuits from [employer] or [employer]- bacon, egg, and cheese or country ham biscuit (satisfied). Dinner would be [place] food- beef, chicken, shrimp, rice, egg drop soup, and fried wontons. It could be doable for pt to cook at home.  [name]'s habits were reviewed today and are as follows: His family eats meals together, he thinks his family will eat healthier with him, his desired weight loss is 8 lbs or below 250 lbs, he has been heavy most of his life, he started gaining weight after having back surgery, his heaviest weight ever was 335 pounds, he has significant food cravings issues, he snacks frequently in the evenings, he skips meals frequently, he is frequently drinking liquids with calories, and he frequently makes poor food choices.  Depression Screen Lee Turner's Food and Mood (modified PHQ-9) score was 0.  Depression screen [facility] 2/9 02/08/2022  Decreased Interest 0  Down, Depressed, Hopeless 0  PHQ - 2 Score 0  Altered sleeping 0  Tired, decreased energy 0  Change in  appetite 0  Feeling bad or failure about yourself  0  Trouble concentrating 0  Moving slowly or fidgety/restless 0  Suicidal thoughts 0  PHQ-9 Score 0  Difficult doing work/chores Not difficult at all   Subjective:   1. Other fatigue Lee Turner admits to daytime somnolence and  occasionally  waking up still tired but normally refreshed. Patient has a history of symptoms of daytime fatigue and morning fatigue. [name] generally gets 5 or 6 hours of sleep per night, and states that he has generally restful sleep. Snoring is present. Apneic episodes are not present. Epworth Sleepiness Score is 8. EKG normal sinus rhythm at 85 bpm.  2. SOBOE (shortness of breath on exertion) [name] notes increasing shortness of breath with exercising and seems to be worsening over time with weight gain. He notes getting out of breath sooner with activity than he used to. This has not gotten worse recently. [name] denies shortness of breath at rest or orthopnea.  3. Type 2 diabetes mellitus with hyperglycemia, without long-term current use of insulin ([place]) Dx'd 2003. [name]'s last A1c was 9.9 in September 2022. He is on Janumet XR 50/100.  4. Hypertension associated with diabetes ([name]) Dx'd more than 20 years ago. BP controlled today and EKG within normal limits. Pt is Rx'd Verapamil.  5. Hyperlipidemia associated with type 2 diabetes mellitus ([place]) His last LDL was 73, and he is Rx'd Tricor and Lipitor.  6. Vitamin D deficiency Pt is not on Vit D supplementation. His last Vit D level was 24.5.  7. Tobacco abuse [name] has smoked  1/2 ppd for 5 years. He does not desire cessation.  Assessment/Plan:   1. Other fatigue [name] does not feel that his weight is causing his energy to be lower than it should be. Fatigue may be related to obesity, depression or many other causes. Labs will be ordered, and in the meanwhile, Lee Turner will focus on self care including making healthy food choices, increasing  physical activity and focusing on stress reduction. Check labs today.  - EKG 12-Lead - Folate - T3 - T4, free - TSH  2. SOBOE (shortness of breath on exertion) Lee Turner does not feel that he gets out of breath more easily that he used to when he exercises. Lee Turner's shortness of breath appears to be obesity related and exercise induced. He has agreed to work on weight loss and gradually increase exercise to treat his exercise induced shortness of breath. Will continue to monitor closely. Check labs today.  - CBC with Differential/Platelet  3. Type 2 diabetes mellitus with hyperglycemia, without long-term current use of insulin (HCC) Good blood sugar control is important to decrease the likelihood of diabetic complications such as nephropathy, neuropathy, limb loss, blindness, coronary artery disease, and death. Intensive lifestyle modification including diet, exercise and weight loss are the first line of treatment for diabetes. Check labs today. Lee Turner will start Ozempic 0.25 mg weekly as Rx'd.  - Vitamin B12 - Hemoglobin A1c - Insulin, random  Start- Semaglutide,0.25 or 0.5MG /DOS, (OZEMPIC, 0.25 OR 0.5 MG/DOSE,) 2 MG/1.5ML SOPN; Inject 0.25 mg into the skin once a week.  Dispense: 1.5 mL; Refill: 0  4. Hypertension associated with diabetes ([place]) Lee Turner is working on healthy weight loss and exercise to improve blood pressure control. We will watch for signs of hypotension as he continues his lifestyle modifications. Check labs today.  - Comprehensive metabolic panel  5. Hyperlipidemia associated with type 2 diabetes mellitus ([place]) Cardiovascular risk and specific lipid/LDL goals reviewed.  We discussed several lifestyle modifications today and Lee Turner will continue to work on diet, exercise and weight loss efforts. Orders and follow up as documented in patient record.   Counseling Intensive lifestyle modifications are the first line treatment for this issue. Dietary changes:  Increase soluble fiber. Decrease simple carbohydrates. Exercise changes: Moderate to vigorous-intensity aerobic activity 150 minutes per week if tolerated. Lipid-lowering medications: see documented in medical record. Check labs today.  - Lipid Panel With LDL/HDL Ratio  6. Vitamin D deficiency Low Vitamin D level contributes to fatigue and are associated with obesity, breast, and colon cancer. He will follow-up for routine testing of Vitamin D, at least 2-3 times per year to avoid over-replacement. Check labs today.  - VITAMIN D 25 Hydroxy (Vit-D Deficiency, Fractures)  7. Tobacco abuse Pt is to inform provider when he desires cessation.  8. Depression screening [place] had a negative depression screening. Depression is commonly associated with obesity and often results in emotional eating behaviors. We will monitor this closely and work on CBT to help improve the non-hunger eating patterns. Referral to Psychology may be required if no improvement is seen as he continues in our clinic.  9. Obesity with current BMI of 35.8 Nathias is currently in the action stage of change and his goal is to continue with weight loss efforts. I recommend Semajay begin the structured treatment plan as follows:  He has agreed to the Category 4 Plan.  Exercise goals: No exercise has been prescribed at this time.   Behavioral modification strategies: increasing lean protein intake, decreasing eating out, no  skipping meals, meal planning and cooking strategies, keeping healthy foods in the home, and planning for success.  He was informed of the importance of frequent follow-up visits to maximize his success with intensive lifestyle modifications for his multiple health conditions. He was informed we would discuss his lab results at his next visit unless there is a critical issue that needs to be addressed sooner. Keiondre agreed to keep his next visit at the agreed upon time to discuss these  results.  Objective:   Blood pressure 129/81, pulse (!) 108, temperature 98.1 F (36.7 C), height 5\' 11"  (1.803 m), weight 257 lb (116.6 kg), SpO2 97 %. Body mass index is 35.84 kg/m.  EKG: Normal sinus rhythm, rate 85.  Indirect Calorimeter completed today shows a VO2 of 323 and a REE of 2232.  His calculated basal metabolic rate is [id number] thus his basal metabolic rate is worse than expected.  General: Cooperative, alert, well developed, in no acute distress. HEENT: Conjunctivae and lids unremarkable. Cardiovascular: Regular rhythm.  Lungs: Normal work of breathing. Neurologic: No focal deficits.   Lab Results  Component Value Date   CREATININE 0.89 02/08/2022   BUN 10 02/08/2022   NA 137 02/08/2022   K 4.7 02/08/2022   CL 98 02/08/2022   CO2 23 02/08/2022   Lab Results  Component Value Date   ALT 45 (H) 02/08/2022   AST 28 02/08/2022   ALKPHOS 54 02/08/2022   BILITOT 0.2 02/08/2022   Lab Results  Component Value Date   HGBA1C 8.4 (H) 02/08/2022   HGBA1C 9.1 (H) 02/04/2019   HGBA1C 10.1 11/10/2018   Lab Results  Component Value Date   INSULIN 18.6 02/08/2022   Lab Results  Component Value Date   TSH 1.200 02/08/2022   Lab Results  Component Value Date   CHOL 137 02/08/2022   HDL 45 02/08/2022   LDLCALC 74 02/08/2022   TRIG 99 02/08/2022   Lab Results  Component Value Date   WBC 4.7 02/08/2022   HGB 14.5 02/08/2022   HCT 42.1 02/08/2022   MCV 92 02/08/2022   PLT 244 02/08/2022    Attestation Statements:   Reviewed by clinician on day of visit: allergies, medications, problem list, medical history, surgical history, family history, social history, and previous encounter notes.  Time spent on visit including pre-visit chart review and post-visit charting and care was 60 minutes.   Coral Ceo, CMA, am acting as transcriptionist for Coralie Common, MD.   This is the patient's first visit at [facility]. The patient's NEW  PATIENT PACKET was reviewed at length. Included in the packet: current and past health history, medications, allergies, ROS, gynecologic history (women only), surgical history, family history, social history, weight history, weight loss surgery history (for those that have had weight loss surgery), nutritional evaluation, mood and food questionnaire, PHQ9, Epworth questionnaire, sleep habits questionnaire, patient life and health improvement goals questionnaire. These will all be scanned into the patient's chart under media.   During the visit, I independently reviewed the patient's EKG, bioimpedance scale results, and indirect calorimeter results. I used this information to tailor a meal plan for the patient that will help him to lose weight and will improve his obesity-related conditions going forward. I performed a medically necessary appropriate examination and/or evaluation. I discussed the assessment and treatment plan with the patient. The patient was provided an opportunity to ask questions and all were answered. The patient agreed with the plan and demonstrated an understanding  of the instructions. Labs were ordered at this visit and will be reviewed at the next visit unless more critical results need to be addressed immediately. Clinical information was updated and documented in the EMR.   Time spent on visit including pre-visit chart review and post-visit care was 67 minutes.   I have reviewed the above documentation for accuracy and completeness, and I agree with the above. - Coralie Common, MD

## 2022-02-12 NOTE — Telephone Encounter (Signed)
Please check PA, Thanks

## 2022-02-27 ENCOUNTER — Ambulatory Visit (INDEPENDENT_AMBULATORY_CARE_PROVIDER_SITE_OTHER): Payer: BC Managed Care – PPO | Admitting: Family Medicine

## 2022-02-27 ENCOUNTER — Encounter (INDEPENDENT_AMBULATORY_CARE_PROVIDER_SITE_OTHER): Payer: Self-pay

## 2022-03-14 ENCOUNTER — Ambulatory Visit (INDEPENDENT_AMBULATORY_CARE_PROVIDER_SITE_OTHER): Payer: BC Managed Care – PPO | Admitting: Family Medicine

## 2022-03-14 ENCOUNTER — Other Ambulatory Visit: Payer: Self-pay

## 2022-03-14 ENCOUNTER — Encounter (INDEPENDENT_AMBULATORY_CARE_PROVIDER_SITE_OTHER): Payer: Self-pay | Admitting: Family Medicine

## 2022-03-14 VITALS — BP 129/76 | HR 78 | Temp 97.7°F | Ht 71.0 in | Wt 258.0 lb

## 2022-03-14 DIAGNOSIS — E1159 Type 2 diabetes mellitus with other circulatory complications: Secondary | ICD-10-CM

## 2022-03-14 DIAGNOSIS — E1165 Type 2 diabetes mellitus with hyperglycemia: Secondary | ICD-10-CM

## 2022-03-14 DIAGNOSIS — Z6836 Body mass index (BMI) 36.0-36.9, adult: Secondary | ICD-10-CM

## 2022-03-14 DIAGNOSIS — E1169 Type 2 diabetes mellitus with other specified complication: Secondary | ICD-10-CM | POA: Diagnosis not present

## 2022-03-14 DIAGNOSIS — E559 Vitamin D deficiency, unspecified: Secondary | ICD-10-CM

## 2022-03-14 DIAGNOSIS — I152 Hypertension secondary to endocrine disorders: Secondary | ICD-10-CM | POA: Diagnosis not present

## 2022-03-14 DIAGNOSIS — Z7985 Long-term (current) use of injectable non-insulin antidiabetic drugs: Secondary | ICD-10-CM

## 2022-03-14 DIAGNOSIS — E669 Obesity, unspecified: Secondary | ICD-10-CM

## 2022-03-14 DIAGNOSIS — E785 Hyperlipidemia, unspecified: Secondary | ICD-10-CM

## 2022-03-14 MED ORDER — VITAMIN D (ERGOCALCIFEROL) 1.25 MG (50000 UNIT) PO CAPS: 50000.0000 [IU] | ORAL_CAPSULE | ORAL | 0 refills | Status: DC

## 2022-03-16 NOTE — Progress Notes (Signed)
? ? ? ?Chief Complaint:  ? ?OBESITY ?Lee Turner is here to discuss his progress with his obesity treatment plan along with follow-up of his obesity related diagnoses. Lee Turner is on the Category 4 Plan and states he is following his eating plan approximately 50% of the time. Lee Turner states he is using his treadmill and swimming in pool 30 minutes 2 times per week. ? ?Today's visit was #: 2 ?Starting weight: 257 lbs ?Starting date: 02/08/2022 ?Today's weight: 258 lbs ?Today's date: 03/14/2022 ?Total lbs lost to date: 0 ?Total lbs lost since last in-office visit: 0 ? ?Interim History: Lee Turner hasn't really been able to be home as much as he is going to PT. Eating on the go more frequently. Getting home at 10:30/11pm and getting up at 4:30am.  He would like to get more on trade. It took 2-3 weeks to get Ozempic, also he is concerned about availability of Ozempic, (he has had 2 doses so far). Maybe 2x/week does he eat 3 meals. ? ?Subjective:  ? ?1. Vitamin D deficiency ?Lee Turner is not on any Vitamin D replacements and notes fatigue. His vitamin D level is 22.7. ? ?2. 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  His LDL 74, HDL 45 and Trig 99. No myalgias or transaminitis. ? ?3. Type 2 diabetes mellitus with hyperglycemia, without long-term current use of insulin (HCC) ?Lee Turner A1C at 8.4, (previously 9.1) diagnosed in 2003/2004. His insulin level  was 18.6 and he is on Ozempic, Sitagliptan-Metformin, with no feelings of hypoglycemia. ? ?4. Hypertension associated with diabetes (HCC) ?Lee Turner's blood pressure is controlled today with no chest pain/chest pressure or headache. He is taking Verapamil. ? ?Assessment/Plan:  ? ?1. Vitamin D deficiency ?Lee Turner agreed to start Vit D 50K IU weekly with no refills. ? ?- Refill Vitamin D, Ergocalciferol, (DRISDOL) 1.25 MG (50000 UNIT) CAPS capsule; Take 1 capsule (50,000 Units total) by mouth every 7 (seven) days.  Dispense: 4  capsule; Refill: 0 ? ?2. Hyperlipidemia associated with type 2 diabetes mellitus (HCC) ?Lee Turner is to continue his current medications with no change in dosage. ? ?3. Type 2 diabetes mellitus with hyperglycemia, without long-term current use of insulin (HCC) ?Lee Turner is to continue on Ozempic+Sitagliptan-Metformin. ? ?4. Hypertension associated with diabetes (HCC) ?Lee Turner will continue Verapamil with no change in dosage. ? ?5. Obesity with current BMI of 36.1 ?Lee Turner is currently in the action stage of change. As such, his goal is to continue with weight loss efforts. He has agreed to the Category 4 Plan.  ? ?Exercise goals: As is. ? ?Behavioral modification strategies: increasing lean protein intake, meal planning and cooking strategies, keeping healthy foods in the home, and planning for success. ? ?Lee Turner has agreed to follow-up with our clinic in 3 weeks. He was informed of the importance of frequent follow-up visits to maximize his success with intensive lifestyle modifications for his multiple health conditions.  ? ?Objective:  ? ?Blood pressure 129/76, pulse 78, temperature 97.7 ?F (36.5 ?C), height 5\' 11"  (1.803 m), weight 258 lb (117 kg), SpO2 99 %. ?Body mass index is 35.98 kg/m?. ? ?General: Cooperative, alert, well developed, in no acute distress. ?HEENT: Conjunctivae and lids unremarkable. ?Cardiovascular: Regular rhythm.  ?Lungs: Normal work of breathing. ?Neurologic: No focal deficits.  ? ?Lab Results  ?Component Value Date  ? CREATININE 0.89 02/08/2022  ? BUN 10 02/08/2022  ? NA 137 02/08/2022  ? K 4.7 02/08/2022  ? CL 98 02/08/2022  ? CO2 23  02/08/2022  ? ?Lab Results  ?Component Value Date  ? ALT 45 (H) 02/08/2022  ? AST 28 02/08/2022  ? ALKPHOS 54 02/08/2022  ? BILITOT 0.2 02/08/2022  ? ?Lab Results  ?Component Value Date  ? HGBA1C 8.4 (H) 02/08/2022  ? HGBA1C 9.1 (H) 02/04/2019  ? HGBA1C 10.1 11/10/2018  ? ?Lab Results  ?Component Value Date  ? INSULIN 18.6 02/08/2022  ? ?Lab Results   ?Component Value Date  ? TSH 1.200 02/08/2022  ? ?Lab Results  ?Component Value Date  ? CHOL 137 02/08/2022  ? HDL 45 02/08/2022  ? LDLCALC 74 02/08/2022  ? TRIG 99 02/08/2022  ? ?Lab Results  ?Component Value Date  ? VD25OH 22.7 (L) 02/08/2022  ? VD25OH 9 (L) 02/04/2019  ? ?Lab Results  ?Component Value Date  ? WBC 4.7 02/08/2022  ? HGB 14.5 02/08/2022  ? HCT 42.1 02/08/2022  ? MCV 92 02/08/2022  ? PLT 244 02/08/2022  ? ?No results found for: IRON, TIBC, FERRITIN ? ?Attestation Statements:  ? ?Reviewed by clinician on day of visit: allergies, medications, problem list, medical history, surgical history, family history, social history, and previous encounter notes. ? ?I, Lee Turner, am acting as transcriptionist for Reuben Likes, MD ? ?I have reviewed the above documentation for accuracy and completeness, and I agree with the above. Reuben Likes, MD ? ?

## 2022-03-21 ENCOUNTER — Other Ambulatory Visit (INDEPENDENT_AMBULATORY_CARE_PROVIDER_SITE_OTHER): Payer: Self-pay | Admitting: Family Medicine

## 2022-03-21 DIAGNOSIS — E559 Vitamin D deficiency, unspecified: Secondary | ICD-10-CM

## 2022-03-22 ENCOUNTER — Ambulatory Visit (INDEPENDENT_AMBULATORY_CARE_PROVIDER_SITE_OTHER): Payer: BC Managed Care – PPO | Admitting: Family Medicine

## 2022-03-22 ENCOUNTER — Encounter (INDEPENDENT_AMBULATORY_CARE_PROVIDER_SITE_OTHER): Payer: Self-pay

## 2022-04-04 ENCOUNTER — Ambulatory Visit (INDEPENDENT_AMBULATORY_CARE_PROVIDER_SITE_OTHER): Payer: BC Managed Care – PPO | Admitting: Family Medicine

## 2022-04-04 ENCOUNTER — Encounter (INDEPENDENT_AMBULATORY_CARE_PROVIDER_SITE_OTHER): Payer: Self-pay | Admitting: Family Medicine

## 2022-04-04 VITALS — BP 113/74 | HR 98 | Temp 98.4°F | Ht 71.0 in | Wt 253.0 lb

## 2022-04-04 DIAGNOSIS — E669 Obesity, unspecified: Secondary | ICD-10-CM

## 2022-04-04 DIAGNOSIS — Z6835 Body mass index (BMI) 35.0-35.9, adult: Secondary | ICD-10-CM

## 2022-04-04 DIAGNOSIS — E1165 Type 2 diabetes mellitus with hyperglycemia: Secondary | ICD-10-CM

## 2022-04-04 DIAGNOSIS — E559 Vitamin D deficiency, unspecified: Secondary | ICD-10-CM | POA: Diagnosis not present

## 2022-04-04 DIAGNOSIS — Z7985 Long-term (current) use of injectable non-insulin antidiabetic drugs: Secondary | ICD-10-CM

## 2022-04-04 MED ORDER — OZEMPIC (0.25 OR 0.5 MG/DOSE) 2 MG/1.5ML ~~LOC~~ SOPN: 0.2500 mg | PEN_INJECTOR | SUBCUTANEOUS | 0 refills | Status: AC

## 2022-04-04 MED ORDER — VITAMIN D (ERGOCALCIFEROL) 1.25 MG (50000 UNIT) PO CAPS
50000.0000 [IU] | ORAL_CAPSULE | ORAL | 0 refills | Status: AC
Start: 2022-04-04 — End: ?

## 2022-04-09 NOTE — Progress Notes (Signed)
? ? ? ?Chief Complaint:  ? ?OBESITY ?Lee Turner is here to discuss his progress with his obesity treatment plan along with follow-up of his obesity related diagnoses. Lee Turner is on the Category 4 Plan and states he is following his eating plan approximately 50% of the time. Lee Turner states he is doing 0 minutes 0 times per week. ? ?Today's visit was #: 3 ?Starting weight: 257 lbs ?Starting date: 02/08/2022 ?Today's weight: 253 lbs ?Today's date: 04/04/2022 ?Total lbs lost to date: 4 ?Total lbs lost since last in-office visit: 5 ? ?Interim History: Lee Turner has been running and keeping himself very busy. Been traveling from [place] to [place]. Just increased Ozempic to 0.5 mg 3 days ago. Not able to get all food in given schedule. Would like to be more consistent on the meal plan in the next few weeks.  ? ?Subjective:  ? ?1. Type 2 diabetes mellitus with hyperglycemia, without long-term current use of insulin (HCC) ?No side effects noted on Ozempic or Janumet. Lee Turner's last A1c was 8.4 and insulin 18.6. ? ?2. Vitamin D deficiency ?Lee Turner is on Vitamin D prescription. He denies nausea, vomiting, or muscle weakness, but notes fatigue.  ? ?Assessment/Plan:  ? ?1. Type 2 diabetes mellitus with hyperglycemia, without long-term current use of insulin (HCC) ?We will refill Ozempic 0.5 mg SubQ weekly for 1 month.  ? ?- Semaglutide,0.25 or 0.5MG /DOS, (OZEMPIC, 0.25 OR 0.5 MG/DOSE,) 2 MG/1.5ML SOPN; Inject 0.25 mg into the skin once a week.  Dispense: 1.5 mL; Refill: 0 ? ?2. Vitamin D deficiency ?We will refill Vitamin D 50,000 IU weekly for 1 month. ? ?- Vitamin D, Ergocalciferol, (DRISDOL) 1.25 MG (50000 UNIT) CAPS capsule; Take 1 capsule (50,000 Units total) by mouth every 7 (seven) days.  Dispense: 4 capsule; Refill: 0 ? ?3. Obesity with current BMI of 35.3 ?Lee Turner is currently in the action stage of change. As such, his goal is to continue with weight loss efforts. He has agreed to the Category 4 Plan.  ? ?Exercise goals:  All adults should avoid inactivity. Some physical activity is better than none, and adults who participate in any amount of physical activity gain some health benefits. ? ?Behavioral modification strategies: increasing lean protein intake, meal planning and cooking strategies, keeping healthy foods in the home, and planning for success. ? ?Lee Turner has agreed to follow-up with our clinic in 3 to 4 weeks. He was informed of the importance of frequent follow-up visits to maximize his success with intensive lifestyle modifications for his multiple health conditions.  ? ?Objective:  ? ?Blood pressure 113/74, pulse 98, temperature 98.4 ?F (36.9 ?C), height 5\' 11"  (1.803 m), weight 253 lb (114.8 kg), SpO2 99 %. ?Body mass index is 35.29 kg/m?. ? ?General: Cooperative, alert, well developed, in no acute distress. ?HEENT: Conjunctivae and lids unremarkable. ?Cardiovascular: Regular rhythm.  ?Lungs: Normal work of breathing. ?Neurologic: No focal deficits.  ? ?Lab Results  ?Component Value Date  ? CREATININE 0.89 02/08/2022  ? BUN 10 02/08/2022  ? NA 137 02/08/2022  ? K 4.7 02/08/2022  ? CL 98 02/08/2022  ? CO2 23 02/08/2022  ? ?Lab Results  ?Component Value Date  ? ALT 45 (H) 02/08/2022  ? AST 28 02/08/2022  ? ALKPHOS 54 02/08/2022  ? BILITOT 0.2 02/08/2022  ? ?Lab Results  ?Component Value Date  ? HGBA1C 8.4 (H) 02/08/2022  ? HGBA1C 9.1 (H) 02/04/2019  ? HGBA1C 10.1 11/10/2018  ? ?Lab Results  ?Component Value Date  ? INSULIN 18.6 02/08/2022  ? ?  Lab Results  ?Component Value Date  ? TSH 1.200 02/08/2022  ? ?Lab Results  ?Component Value Date  ? CHOL 137 02/08/2022  ? HDL 45 02/08/2022  ? LDLCALC 74 02/08/2022  ? TRIG 99 02/08/2022  ? ?Lab Results  ?Component Value Date  ? VD25OH 22.7 (L) 02/08/2022  ? VD25OH 9 (L) 02/04/2019  ? ?Lab Results  ?Component Value Date  ? WBC 4.7 02/08/2022  ? HGB 14.5 02/08/2022  ? HCT 42.1 02/08/2022  ? MCV 92 02/08/2022  ? PLT 244 02/08/2022  ? ?No results found for: IRON, TIBC,  FERRITIN ? ?Attestation Statements:  ? ?Reviewed by clinician on day of visit: allergies, medications, problem list, medical history, surgical history, family history, social history, and previous encounter notes. ? ? ?I, Burt Knack, am acting as transcriptionist for Reuben Likes, MD. ? ?I have reviewed the above documentation for accuracy and completeness, and I agree with the above. Reuben Likes, MD ? ? ?

## 2022-05-03 ENCOUNTER — Ambulatory Visit (INDEPENDENT_AMBULATORY_CARE_PROVIDER_SITE_OTHER): Payer: BC Managed Care – PPO | Admitting: Family Medicine

## 2022-05-07 ENCOUNTER — Other Ambulatory Visit (INDEPENDENT_AMBULATORY_CARE_PROVIDER_SITE_OTHER): Payer: Self-pay | Admitting: Family Medicine

## 2022-05-07 DIAGNOSIS — E559 Vitamin D deficiency, unspecified: Secondary | ICD-10-CM

## 2022-05-18 ENCOUNTER — Ambulatory Visit: Payer: Self-pay | Admitting: Orthopedic Surgery

## 2022-05-22 ENCOUNTER — Ambulatory Visit (INDEPENDENT_AMBULATORY_CARE_PROVIDER_SITE_OTHER): Payer: BC Managed Care – PPO | Admitting: Family Medicine

## 2022-05-22 ENCOUNTER — Encounter (INDEPENDENT_AMBULATORY_CARE_PROVIDER_SITE_OTHER): Payer: Self-pay

## 2022-06-05 NOTE — Pre-Procedure Instructions (Signed)
Surgical Instructions    Your procedure is scheduled on Thursday 06/14/22.   Report to [facility] Main [facility]trance "A" at 05:30 A.M., then check in with the Admitting office.  Call this number if you have problems the morning of surgery:  [phone]   If you have any questions prior to your surgery date call [phone]: Open Monday-Friday 8am-4pm    Remember:  Do not eat or drink after midnight the night before your surgery    Take these medicines the morning of surgery with A SIP OF WATER:   fenofibrate (TRICOR)  Fluticasone-Salmeterol (ADVAIR)  pregabalin (LYRICA)   Take these medicines if needed:   As of today, STOP taking any Aspirin (unless otherwise instructed by your surgeon) Aleve, Naproxen, Ibuprofen, Motrin, Advil, Goody's, BC's, all herbal medications, fish oil, diclofenac (VOLTAREN), and all vitamins.  WHAT DO I DO ABOUT MY DIABETES MEDICATION?   Do not take oral diabetes medicines (pills) the morning of surgery.  DO NOT TAKE SitaGLIPtin-MetFORMIN the morning of surgery.   The day of surgery, do not take other diabetes injectables, including Byetta (exenatide), Bydureon (exenatide ER), Victoza (liraglutide), Trulicity (dulaglutide), and Semaglutide (Ozempic).   HOW TO MANAGE YOUR DIABETES BEFORE AND AFTER SURGERY  Why is it important to control my blood sugar before and after surgery? Improving blood sugar levels before and after surgery helps healing and can limit problems. A way of improving blood sugar control is eating a healthy diet by:  Eating less sugar and carbohydrates  Increasing activity/exercise  Talking with your doctor about reaching your blood sugar goals High blood sugars (greater than 180 mg/dL) can raise your risk of infections and slow your recovery, so you will need to focus on controlling your diabetes during the weeks before surgery. Make sure that the doctor who takes care of your diabetes knows about your planned surgery including the  date and location.  How do I manage my blood sugar before surgery? Check your blood sugar at least 4 times a day, starting 2 days before surgery, to make sure that the level is not too high or low.  Check your blood sugar the morning of your surgery when you wake up and every 2 hours until you get to the Short Stay unit.  If your blood sugar is less than 70 mg/dL, you will need to treat for low blood sugar: Do not take insulin. Treat a low blood sugar (less than 70 mg/dL) with  cup of clear juice (cranberry or apple), 4 glucose tablets, OR glucose gel. Recheck blood sugar in 15 minutes after treatment (to make sure it is greater than 70 mg/dL). If your blood sugar is not greater than 70 mg/dL on recheck, call [phone] for further instructions. Report your blood sugar to the short stay nurse when you get to Short Stay.  If you are admitted to the hospital after surgery: Your blood sugar will be checked by the staff and you will probably be given insulin after surgery (instead of oral diabetes medicines) to make sure you have good blood sugar levels. The goal for blood sugar control after surgery is 80-180 mg/dL.           Do not wear jewelry or makeup Do not wear lotions, powders, perfumes/colognes, or deodorant. Do not shave 48 hours prior to surgery.  Men may shave face and neck. Do not bring valuables to the hospital. Do not wear nail polish, gel polish, artificial nails, or any other type of covering on natural  nails (fingers and toes) If you have artificial nails or gel coating that need to be removed by a nail salon, please have this removed prior to surgery. Artificial nails or gel coating may interfere with anesthesia's ability to adequately monitor your vital signs.  [facility] is not responsible for any belongings or valuables. .   Do NOT Smoke (Tobacco/Vaping)  24 hours prior to your procedure  If you use a CPAP at night, you may bring your mask for your overnight stay.    Contacts, glasses, hearing aids, dentures or partials may not be worn into surgery, please bring cases for these belongings   For patients admitted to the hospital, discharge time will be determined by your treatment team.   Patients discharged the day of surgery will not be allowed to drive home, and someone needs to stay with them for 24 hours.   SURGICAL WAITING ROOM VISITATION Patients having surgery or a procedure in a hospital may have two support people. Children under the age of 4 must have an adult with them who is not the patient. They may stay in the waiting area during the procedure and may switch out with other visitors. If the patient needs to stay at the hospital during part of their recovery, the visitor guidelines for inpatient rooms apply.  Please refer to the [facility] website for the visitor guidelines for Inpatients (after your surgery is over and you are in a regular room).       Special instructions:    Oral Hygiene is also important to reduce your risk of infection.  Remember - BRUSH YOUR TEETH THE MORNING OF SURGERY WITH YOUR REGULAR TOOTHPASTE   [facility]- Preparing For Surgery  Before surgery, you can play an important role. Because skin is not sterile, your skin needs to be as free of germs as possible. You can reduce the number of germs on your skin by washing with CHG (chlorahexidine gluconate) Soap before surgery.  CHG is an antiseptic cleaner which kills germs and bonds with the skin to continue killing germs even after washing.     Please do not use if you have an allergy to CHG or antibacterial soaps. If your skin becomes reddened/irritated stop using the CHG.  Do not shave (including legs and underarms) for at least 48 hours prior to first CHG shower. It is OK to shave your face.  Please follow these instructions carefully.     Shower the NIGHT BEFORE SURGERY and the MORNING OF SURGERY with CHG Soap.   If you chose to wash your hair, wash  your hair first as usual with your normal shampoo. After you shampoo, rinse your hair and body thoroughly to remove the shampoo.  Then [employer] and genitals (private parts) with your normal soap and rinse thoroughly to remove soap.  After that Use CHG Soap as you would any other liquid soap. You can apply CHG directly to the skin and wash gently with a scrungie or a clean washcloth.   Apply the CHG Soap to your body ONLY FROM THE NECK DOWN.  Do not use on open wounds or open sores. Avoid contact with your eyes, ears, mouth and genitals (private parts). Wash Face and genitals (private parts)  with your normal soap.   Wash thoroughly, paying special attention to the area where your surgery will be performed.  Thoroughly rinse your body with warm water from the neck down.  DO NOT shower/wash with your normal soap after using and  rinsing off the CHG Soap.  Pat yourself dry with a CLEAN TOWEL.  Wear CLEAN PAJAMAS to bed the night before surgery  Place CLEAN SHEETS on your bed the night before your surgery  DO NOT SLEEP WITH PETS.   Day of Surgery:  Take a shower with CHG soap. Wear Clean/Comfortable clothing the morning of surgery Do not apply any deodorants/lotions.   Remember to brush your teeth WITH YOUR REGULAR TOOTHPASTE.    If you received a COVID test during your pre-op visit, it is requested that you wear a mask when out in public, stay away from anyone that may not be feeling well, and notify your surgeon if you develop symptoms. If you have been in contact with anyone that has tested positive in the last 10 days, please notify your surgeon.    Please read over the following fact sheets that you were given.

## 2022-06-06 ENCOUNTER — Encounter (HOSPITAL_COMMUNITY): Payer: Self-pay

## 2022-06-06 ENCOUNTER — Other Ambulatory Visit: Payer: Self-pay

## 2022-06-06 ENCOUNTER — Encounter (HOSPITAL_COMMUNITY)
Admission: RE | Admit: 2022-06-06 | Discharge: 2022-06-06 | Disposition: A | Discharge status: Home / Self Care | Payer: BC Managed Care – PPO | Source: Ambulatory Visit | Attending: Orthopedic Surgery | Admitting: Orthopedic Surgery

## 2022-06-06 VITALS — BP 140/82 | Temp 97.5°F | Resp 17 | Ht 72.0 in | Wt 264.4 lb

## 2022-06-06 DIAGNOSIS — Z01812 Encounter for preprocedural laboratory examination: Secondary | ICD-10-CM | POA: Diagnosis present

## 2022-06-06 DIAGNOSIS — Z01818 Encounter for other preprocedural examination: Secondary | ICD-10-CM

## 2022-06-06 DIAGNOSIS — I10 Essential (primary) hypertension: Secondary | ICD-10-CM | POA: Diagnosis not present

## 2022-06-06 LAB — CBC
HCT: 44.4 % (ref 39.0–52.0)
Hemoglobin: 14.8 g/dL (ref 13.0–17.0)
MCH: 31.6 pg (ref 26.0–34.0)
MCHC: 33.3 g/dL (ref 30.0–36.0)
MCV: 94.9 fL (ref 80.0–100.0)
Platelets: 268 10*3/uL (ref 150–400)
RBC: 4.68 MIL/uL (ref 4.22–5.81)
RDW: 13.8 % (ref 11.5–15.5)
WBC: 7.8 10*3/uL (ref 4.0–10.5)
nRBC: 0 % (ref 0.0–0.2)

## 2022-06-06 LAB — BASIC METABOLIC PANEL
Anion gap: 11 (ref 5–15)
BUN: 14 mg/dL (ref 6–20)
CO2: 22 mmol/L (ref 22–32)
Calcium: 9.5 mg/dL (ref 8.9–10.3)
Chloride: 104 mmol/L (ref 98–111)
Creatinine, Ser: 0.9 mg/dL (ref 0.61–1.24)
GFR, Estimated: >60 mL/min (ref >60)
Glucose, Bld: 150 mg/dL — ABNORMAL HIGH (ref 70–99)
Potassium: 4.1 mmol/L (ref 3.5–5.1)
Sodium: 137 mmol/L (ref 135–145)

## 2022-06-06 LAB — SURGICAL PCR SCREEN
MRSA, PCR: NEGATIVE
Staphylococcus aureus: NEGATIVE

## 2022-06-06 LAB — HEMOGLOBIN A1C
Hgb A1c MFr Bld: 6.8 % — ABNORMAL HIGH (ref 4.8–5.6)
Mean Plasma Glucose: 148.46 mg/dL

## 2022-06-06 LAB — GLUCOSE, CAPILLARY: Glucose-Capillary: 192 mg/dL — ABNORMAL HIGH (ref 70–99)

## 2022-06-06 NOTE — Progress Notes (Signed)
   06/06/22 1546  OBSTRUCTIVE SLEEP APNEA  Have you ever been diagnosed with sleep apnea through a sleep study? No  Do you snore loudly (loud enough to be heard through closed doors)?  1  Has anyone observed you stop breathing during your sleep? 1  Do you have, or are you being treated for high blood pressure? 1  BMI more than 35 kg/m2? 1  Age > 50 (1-yes) 1  Neck circumference greater than:Male 16 inches or larger, Male 17inches or larger? 1  Male Gender (Yes=1) 1  Obstructive Sleep Apnea Score 7  Score 5 or greater  Results sent to PCP

## 2022-06-06 NOTE — Progress Notes (Addendum)
PCP - Kizzie Bane Cardiologist - Denies  PPM/ICD - Denies Device Orders -  Rep Notified -   Chest x-ray - NI  EKG - 02/08/22 Stress Test - "I have had a stress test" everything was ok  ECHO - 2006 Cardiac Cath - 2006 - negative per patient, no problems since then  Sleep Study - No  CPAP -   DM - CBG @ PAT appt 192 Fasting Blood Sugar - 110's Checks Blood Sugar weekly   Blood Thinner Instructions:Denies Aspirin Instructions:Denies   Anesthesia review: No  Patient denies shortness of breath, fever, cough and chest pain at PAT appointment   All instructions explained to the patient, with a verbal understanding of the material. Patient agrees to go over the instructions while at home for a better understanding. The opportunity to ask questions was provided.

## 2022-06-14 ENCOUNTER — Ambulatory Visit (HOSPITAL_COMMUNITY): Payer: BC Managed Care – PPO | Admitting: Anesthesiology

## 2022-06-14 ENCOUNTER — Encounter (HOSPITAL_COMMUNITY)
Admission: RE | Disposition: A | Discharge status: Home / Self Care | Payer: Self-pay | Source: Home / Self Care | Attending: Orthopedic Surgery

## 2022-06-14 ENCOUNTER — Ambulatory Visit (HOSPITAL_COMMUNITY): Payer: BC Managed Care – PPO

## 2022-06-14 ENCOUNTER — Ambulatory Visit (HOSPITAL_COMMUNITY)
Admission: RE | Admit: 2022-06-14 | Discharge: 2022-06-14 | Disposition: A | Discharge status: Home / Self Care | Payer: BC Managed Care – PPO | Attending: Orthopedic Surgery | Admitting: Orthopedic Surgery

## 2022-06-14 ENCOUNTER — Ambulatory Visit (HOSPITAL_COMMUNITY): Payer: BC Managed Care – PPO | Admitting: Physician Assistant

## 2022-06-14 ENCOUNTER — Other Ambulatory Visit: Payer: Self-pay

## 2022-06-14 ENCOUNTER — Encounter (HOSPITAL_COMMUNITY): Payer: Self-pay | Admitting: Orthopedic Surgery

## 2022-06-14 DIAGNOSIS — M5416 Radiculopathy, lumbar region: Secondary | ICD-10-CM | POA: Insufficient documentation

## 2022-06-14 DIAGNOSIS — E669 Obesity, unspecified: Secondary | ICD-10-CM | POA: Diagnosis not present

## 2022-06-14 DIAGNOSIS — E119 Type 2 diabetes mellitus without complications: Secondary | ICD-10-CM | POA: Diagnosis not present

## 2022-06-14 DIAGNOSIS — M7138 Other bursal cyst, other site: Secondary | ICD-10-CM | POA: Insufficient documentation

## 2022-06-14 DIAGNOSIS — Z6835 Body mass index (BMI) 35.0-35.9, adult: Secondary | ICD-10-CM | POA: Diagnosis not present

## 2022-06-14 DIAGNOSIS — F17200 Nicotine dependence, unspecified, uncomplicated: Secondary | ICD-10-CM | POA: Diagnosis not present

## 2022-06-14 DIAGNOSIS — J45909 Unspecified asthma, uncomplicated: Secondary | ICD-10-CM | POA: Insufficient documentation

## 2022-06-14 DIAGNOSIS — I10 Essential (primary) hypertension: Secondary | ICD-10-CM | POA: Insufficient documentation

## 2022-06-14 HISTORY — PX: LUMBAR LAMINECTOMY/DECOMPRESSION MICRODISCECTOMY: SHX5026

## 2022-06-14 LAB — GLUCOSE, CAPILLARY
Glucose-Capillary: 133 mg/dL — ABNORMAL HIGH (ref 70–99)
Glucose-Capillary: 140 mg/dL — ABNORMAL HIGH (ref 70–99)

## 2022-06-14 SURGERY — LUMBAR LAMINECTOMY/DECOMPRESSION MICRODISCECTOMY 1 LEVEL
Anesthesia: General | Site: Spine Lumbar

## 2022-06-14 MED ORDER — ONDANSETRON HCL 4 MG/2ML IJ SOLN: 4.0000 mg | Freq: Four times a day (QID) | INTRAMUSCULAR | Status: DC | PRN

## 2022-06-14 MED ORDER — ORAL CARE MOUTH RINSE: 15.0000 mL | Freq: Once | OROMUCOSAL | Status: AC

## 2022-06-14 MED ORDER — FENTANYL CITRATE (PF) 250 MCG/5ML IJ SOLN
INTRAMUSCULAR | Status: DC | PRN
Start: 2022-06-14 — End: 2022-06-14
  Administered 2022-06-14: 50 ug via INTRAVENOUS
  Administered 2022-06-14: 150 ug via INTRAVENOUS
  Administered 2022-06-14: 50 ug via INTRAVENOUS

## 2022-06-14 MED ORDER — SURGIFLO WITH THROMBIN (HEMOSTATIC MATRIX KIT) OPTIME
TOPICAL | Status: DC | PRN
  Administered 2022-06-14: 1 via TOPICAL

## 2022-06-14 MED ORDER — CHLORHEXIDINE GLUCONATE 0.12 % MT SOLN
15.0000 mL | Freq: Once | OROMUCOSAL | Status: AC
  Administered 2022-06-14: 15 mL via OROMUCOSAL
  Filled 2022-06-14: qty 15

## 2022-06-14 MED ORDER — PROPOFOL 10 MG/ML IV BOLUS
INTRAVENOUS | Status: DC | PRN
  Administered 2022-06-14: 200 mg via INTRAVENOUS

## 2022-06-14 MED ORDER — MIDAZOLAM HCL 2 MG/2ML IJ SOLN
INTRAMUSCULAR | Status: AC
Start: 2022-06-14 — End: ?
  Filled 2022-06-14: qty 2

## 2022-06-14 MED ORDER — CEFAZOLIN IN SODIUM CHLORIDE 3-0.9 GM/100ML-% IV SOLN
3.0000 g | INTRAVENOUS | Status: AC
  Administered 2022-06-14: 3 g via INTRAVENOUS
  Filled 2022-06-14: qty 100

## 2022-06-14 MED ORDER — LIDOCAINE 2% (20 MG/ML) 5 ML SYRINGE
INTRAMUSCULAR | Status: DC | PRN
  Administered 2022-06-14: 60 mg via INTRAVENOUS

## 2022-06-14 MED ORDER — BUPIVACAINE-EPINEPHRINE 0.25% -1:200000 IJ SOLN
INTRAMUSCULAR | Status: DC | PRN
  Administered 2022-06-14: 10 mL

## 2022-06-14 MED ORDER — FENTANYL CITRATE (PF) 250 MCG/5ML IJ SOLN
INTRAMUSCULAR | Status: AC
  Filled 2022-06-14: qty 5

## 2022-06-14 MED ORDER — MIDAZOLAM HCL 2 MG/2ML IJ SOLN
INTRAMUSCULAR | Status: DC | PRN
  Administered 2022-06-14: 2 mg via INTRAVENOUS

## 2022-06-14 MED ORDER — ONDANSETRON HCL 4 MG/2ML IJ SOLN
INTRAMUSCULAR | Status: DC | PRN
  Administered 2022-06-14: 4 mg via INTRAVENOUS

## 2022-06-14 MED ORDER — METHOCARBAMOL 500 MG PO TABS: 500.0000 mg | ORAL_TABLET | Freq: Three times a day (TID) | ORAL | 0 refills | Status: AC | PRN

## 2022-06-14 MED ORDER — THROMBIN 20000 UNITS EX SOLR
CUTANEOUS | Status: DC | PRN
  Administered 2022-06-14: 20 mL via TOPICAL

## 2022-06-14 MED ORDER — OXYCODONE HCL 5 MG PO TABS
ORAL_TABLET | ORAL | Status: AC
  Filled 2022-06-14: qty 1

## 2022-06-14 MED ORDER — FENTANYL CITRATE (PF) 100 MCG/2ML IJ SOLN
25.0000 ug | INTRAMUSCULAR | Status: DC | PRN
  Administered 2022-06-14: 25 ug via INTRAVENOUS

## 2022-06-14 MED ORDER — PROPOFOL 10 MG/ML IV BOLUS
INTRAVENOUS | Status: AC
  Filled 2022-06-14: qty 20

## 2022-06-14 MED ORDER — OXYCODONE-ACETAMINOPHEN 10-325 MG PO TABS: 1.0000 | ORAL_TABLET | Freq: Four times a day (QID) | ORAL | 0 refills | Status: AC | PRN

## 2022-06-14 MED ORDER — PHENYLEPHRINE 80 MCG/ML (10ML) SYRINGE FOR IV PUSH (FOR BLOOD PRESSURE SUPPORT)
PREFILLED_SYRINGE | INTRAVENOUS | Status: DC | PRN
  Administered 2022-06-14 (×3): 80 ug via INTRAVENOUS

## 2022-06-14 MED ORDER — PHENYLEPHRINE HCL-NACL 20-0.9 MG/250ML-% IV SOLN
INTRAVENOUS | Status: DC | PRN
  Administered 2022-06-14: 30 ug/min via INTRAVENOUS

## 2022-06-14 MED ORDER — BUPIVACAINE-EPINEPHRINE (PF) 0.25% -1:200000 IJ SOLN
INTRAMUSCULAR | Status: AC
  Filled 2022-06-14: qty 30

## 2022-06-14 MED ORDER — FENTANYL CITRATE (PF) 100 MCG/2ML IJ SOLN
INTRAMUSCULAR | Status: AC
  Filled 2022-06-14: qty 2

## 2022-06-14 MED ORDER — LACTATED RINGERS IV SOLN: INTRAVENOUS | Status: DC

## 2022-06-14 MED ORDER — INSULIN ASPART 100 UNIT/ML IJ SOLN: 0.0000 [IU] | INTRAMUSCULAR | Status: DC | PRN

## 2022-06-14 MED ORDER — THROMBIN 20000 UNITS EX SOLR
CUTANEOUS | Status: AC
Start: 2022-06-14 — End: ?
  Filled 2022-06-14: qty 20000

## 2022-06-14 MED ORDER — SUGAMMADEX SODIUM 200 MG/2ML IV SOLN
INTRAVENOUS | Status: DC | PRN
  Administered 2022-06-14: 400 mg via INTRAVENOUS

## 2022-06-14 MED ORDER — OXYCODONE HCL 5 MG PO TABS
5.0000 mg | ORAL_TABLET | Freq: Once | ORAL | Status: AC | PRN
  Administered 2022-06-14: 5 mg via ORAL

## 2022-06-14 MED ORDER — ROCURONIUM BROMIDE 10 MG/ML (PF) SYRINGE
PREFILLED_SYRINGE | INTRAVENOUS | Status: DC | PRN
  Administered 2022-06-14: 30 mg via INTRAVENOUS
  Administered 2022-06-14: 70 mg via INTRAVENOUS

## 2022-06-14 MED ORDER — ONDANSETRON HCL 4 MG PO TABS: 4.0000 mg | ORAL_TABLET | Freq: Three times a day (TID) | ORAL | 0 refills | Status: AC | PRN

## 2022-06-14 MED ORDER — OXYCODONE HCL 5 MG/5ML PO SOLN: 5.0000 mg | Freq: Once | ORAL | Status: AC | PRN

## 2022-06-14 MED ORDER — DEXAMETHASONE SODIUM PHOSPHATE 10 MG/ML IJ SOLN
INTRAMUSCULAR | Status: DC | PRN
  Administered 2022-06-14: 10 mg via INTRAVENOUS

## 2022-06-14 MED ORDER — 0.9 % SODIUM CHLORIDE (POUR BTL) OPTIME
TOPICAL | Status: DC | PRN
  Administered 2022-06-14: 1000 mL

## 2022-06-14 MED ORDER — TRANEXAMIC ACID-NACL 1000-0.7 MG/100ML-% IV SOLN
1000.0000 mg | INTRAVENOUS | Status: AC
  Administered 2022-06-14: 1000 mg via INTRAVENOUS
  Filled 2022-06-14: qty 100

## 2022-06-14 SURGICAL SUPPLY — 57 items
BAG COUNTER SPONGE SURGICOUNT (BAG) ×2 IMPLANT
BNDG GAUZE ELAST 4 BULKY (GAUZE/BANDAGES/DRESSINGS) ×2 IMPLANT
CANISTER SUCT 3000ML PPV (MISCELLANEOUS) ×2 IMPLANT
CLSR STERI-STRIP ANTIMIC 1/2X4 (GAUZE/BANDAGES/DRESSINGS) ×2 IMPLANT
CORD BIPOLAR FORCEPS 12FT (ELECTRODE) ×2 IMPLANT
COVER SURGICAL LIGHT HANDLE (MISCELLANEOUS) ×2 IMPLANT
DRAIN CHANNEL 15F RND FF W/TCR (WOUND CARE) IMPLANT
DRAPE SURG 17X23 STRL (DRAPES) ×2 IMPLANT
DRAPE U-SHAPE 47X51 STRL (DRAPES) ×2 IMPLANT
DRSG OPSITE POSTOP 3X4 (GAUZE/BANDAGES/DRESSINGS) ×1 IMPLANT
DRSG OPSITE POSTOP 4X6 (GAUZE/BANDAGES/DRESSINGS) ×1 IMPLANT
DURAPREP 26ML APPLICATOR (WOUND CARE) ×2 IMPLANT
ELECT BLADE 4.0 EZ CLEAN MEGAD (MISCELLANEOUS) ×2
ELECT CAUTERY BLADE 6.4 (BLADE) ×2 IMPLANT
ELECT PENCIL ROCKER SW 15FT (MISCELLANEOUS) ×2 IMPLANT
ELECT REM PT RETURN 9FT ADLT (ELECTROSURGICAL) ×2
ELECTRODE BLDE 4.0 EZ CLN MEGD (MISCELLANEOUS) IMPLANT
ELECTRODE REM PT RTRN 9FT ADLT (ELECTROSURGICAL) ×1 IMPLANT
EVACUATOR SILICONE 100CC (DRAIN) IMPLANT
GLOVE BIO SURGEON STRL SZ 6.5 (GLOVE) ×2 IMPLANT
GLOVE BIOGEL PI IND STRL 6.5 (GLOVE) ×1 IMPLANT
GLOVE BIOGEL PI IND STRL 8.5 (GLOVE) ×1 IMPLANT
GLOVE BIOGEL PI INDICATOR 6.5 (GLOVE) ×1
GLOVE BIOGEL PI INDICATOR 8.5 (GLOVE) ×1
GLOVE SS BIOGEL STRL SZ 8.5 (GLOVE) ×1 IMPLANT
GLOVE SUPERSENSE BIOGEL SZ 8.5 (GLOVE) ×1
GOWN STRL REUS W/ TWL LRG LVL3 (GOWN DISPOSABLE) ×2 IMPLANT
GOWN STRL REUS W/TWL 2XL LVL3 (GOWN DISPOSABLE) ×2 IMPLANT
GOWN STRL REUS W/TWL LRG LVL3 (GOWN DISPOSABLE) ×1
KIT BASIN OR (CUSTOM PROCEDURE TRAY) ×2 IMPLANT
KIT TURNOVER KIT B (KITS) ×2 IMPLANT
NDL SPNL 18GX3.5 QUINCKE PK (NEEDLE) ×2 IMPLANT
NEEDLE 22X1 1/2 (OR ONLY) (NEEDLE) ×2 IMPLANT
NEEDLE SPNL 18GX3.5 QUINCKE PK (NEEDLE) ×4 IMPLANT
NS IRRIG 1000ML POUR BTL (IV SOLUTION) ×2 IMPLANT
PACK LAMINECTOMY ORTHO (CUSTOM PROCEDURE TRAY) ×2 IMPLANT
PACK UNIVERSAL I (CUSTOM PROCEDURE TRAY) ×2 IMPLANT
PAD ARMBOARD 7.5X6 YLW CONV (MISCELLANEOUS) ×4 IMPLANT
PATTIES SURGICAL .5 X.5 (GAUZE/BANDAGES/DRESSINGS) ×2 IMPLANT
PATTIES SURGICAL .5 X1 (DISPOSABLE) ×2 IMPLANT
SPONGE SURGIFOAM ABS GEL 100 (HEMOSTASIS) ×1 IMPLANT
SPONGE T-LAP 4X18 ~~LOC~~+RFID (SPONGE) ×4 IMPLANT
SURGIFLO W/THROMBIN 8M KIT (HEMOSTASIS) ×1 IMPLANT
SUT BONE WAX W31G (SUTURE) ×2 IMPLANT
SUT MNCRL+ AB 3-0 CT1 36 (SUTURE) ×1 IMPLANT
SUT MONOCRYL AB 3-0 CT1 36IN (SUTURE) ×1
SUT VIC AB 0 CT1 27 (SUTURE) ×1
SUT VIC AB 0 CT1 27XBRD ANBCTR (SUTURE) IMPLANT
SUT VIC AB 1 CT1 18XCR BRD 8 (SUTURE) ×1 IMPLANT
SUT VIC AB 1 CT1 8-18 (SUTURE) ×1
SUT VIC AB 2-0 CT1 18 (SUTURE) ×2 IMPLANT
SYR BULB IRRIG 60ML STRL (SYRINGE) ×3 IMPLANT
SYR CONTROL 10ML LL (SYRINGE) ×2 IMPLANT
TOWEL GREEN STERILE (TOWEL DISPOSABLE) ×2 IMPLANT
TOWEL GREEN STERILE FF (TOWEL DISPOSABLE) ×2 IMPLANT
WATER STERILE IRR 1000ML POUR (IV SOLUTION) ×1 IMPLANT
YANKAUER SUCT BULB TIP NO VENT (SUCTIONS) ×1 IMPLANT

## 2022-06-14 NOTE — Brief Op Note (Signed)
06/14/2022  9:44 AM  PATIENT:  Lee Turner  56 y.o. male  PRE-OPERATIVE DIAGNOSIS:  Left L2-3 cyst with L3 radiculopathy  POST-OPERATIVE DIAGNOSIS:  Left L2-3 cyst with L3 radiculopathy  PROCEDURE:  Procedure(s) with comments: LUMBAR TWO TO THREE DECOMPRESSION AND REMOVAL OF CYST (N/A) - 3 hrs 3 C-bed  SURGEON:  Surgeon(s) and Role:    Venita Lick, MD - Primary  PHYSICIAN ASSISTANT:   ASSISTANTS: none   ANESTHESIA:   general  EBL:  35 mL   BLOOD ADMINISTERED:none  DRAINS: none   LOCAL MEDICATIONS USED:  MARCAINE     SPECIMEN:  Excision  DISPOSITION OF SPECIMEN:  PATHOLOGY  COUNTS:  YES  TOURNIQUET:  * No tourniquets in log *  DICTATION: .Dragon Dictation  PLAN OF CARE: Admit for overnight observation  PATIENT DISPOSITION:  PACU - hemodynamically stable.

## 2022-06-14 NOTE — Anesthesia Procedure Notes (Signed)
Procedure Name: Intubation Date/Time: 06/14/2022 7:45 AM  Performed by: Macie Burows, CRNAPre-anesthesia Checklist: Patient identified, Emergency Drugs available, Suction available and Patient being monitored Patient Re-evaluated:Patient Re-evaluated prior to induction Oxygen Delivery Method: Circle system utilized Preoxygenation: Pre-oxygenation with 100% oxygen Induction Type: IV induction Ventilation: Mask ventilation without difficulty and Oral airway inserted - appropriate to patient size Laryngoscope Size: Glidescope and 4 Grade View: Grade I Tube type: Oral Tube size: 7.5 mm Number of attempts: 1 Airway Equipment and Method: Rigid stylet and Video-laryngoscopy Placement Confirmation: ETT inserted through vocal cords under direct vision, positive ETCO2 and breath sounds checked- equal and bilateral Secured at: 23 cm Tube secured with: Tape Dental Injury: Teeth and Oropharynx as per pre-operative assessment

## 2022-06-14 NOTE — H&P (Signed)
History: Lee Turner presents today for his preop H&P. He continues to have severe left anterior and lateral thigh pain consistent with L3 radiculopathy. 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  Past Medical History:  Diagnosis Date   Asthma    Back pain    Diabetes mellitus, type II (HCC)    Hyperlipidemia    Hypertension    Leg pain    Neck pain    Numbness     No Known Allergies  No current facility-administered medications on file prior to encounter.   Current Outpatient Medications on File Prior to Encounter  Medication Sig Dispense Refill   Aspirin-Acetaminophen-Caffeine (GOODY HEADACHE PO) Take 1 Package by mouth 2 (two) times daily.     atorvastatin (LIPITOR) 40 MG tablet TAKE 1 TABLET(40 MG) BY MOUTH AT BEDTIME 90 tablet 1   desloratadine (CLARINEX) 5 MG tablet Take 5 mg by mouth daily.     diclofenac (VOLTAREN) 75 MG EC tablet Take 75 mg by mouth 2 (two) times daily.     fenofibrate (TRICOR) 48 MG tablet Take 48 mg by mouth daily.     Fluticasone-Salmeterol (ADVAIR) 250-50 MCG/DOSE AEPB Inhale 1 puff into the lungs 2 (two) times daily.     ibuprofen (ADVIL) 200 MG tablet Take 800 mg by mouth 2 (two) times daily.     Ipratropium-Albuterol (COMBIVENT IN) Inhale 2 puffs into the lungs 3 (three) times daily as needed (Asthma).     montelukast (SINGULAIR) 10 MG tablet Take 10 mg by mouth at bedtime.     pregabalin (LYRICA) 75 MG capsule Take 75 mg by mouth 2 (two) times daily.     Semaglutide,0.25 or 0.5MG/DOS, (OZEMPIC, 0.25 OR 0.5 MG/DOSE,) 2 MG/1.5ML SOPN Inject 0.25 mg into the skin once a week. (Patient taking differently: Inject 0.5 mg into the skin once a week.) 1.5 mL 0   SitaGLIPtin-MetFORMIN HCl 50-1000 MG TB24 Take 1 tablet by mouth daily  with breakfast.     verapamil (CALAN-SR) 240 MG CR tablet Take 240 mg by mouth at bedtime.     Vitamin D, Ergocalciferol, (DRISDOL) 1.25 MG (50000 UNIT) CAPS capsule Take 1 capsule (50,000 Units total) by mouth every 7 (seven) days. 4 capsule 0   Blood Glucose Monitoring Suppl (ACCU-CHEK GUIDE ME) w/Device KIT 1 Piece by Does not apply route as directed. 1 kit 0   glucose blood (ACCU-CHEK GUIDE) test strip Use as instructed 150 each 2    Physical Exam: Vitals:   06/14/22 0605 06/14/22 0607  BP:  (!) 143/81  Pulse: 84   Resp: 18   Temp: 98.6 F (37 C)   SpO2: 97%    Body mass index is 35.26 kg/m. Lee Turner is a pleasant individual, who appears younger than their stated age.   He is alert and orientated 3.   No shortness of breath, chest pain.   Abdomen is soft and non-tender, negative loss of bowel and bladder control, no rebound tenderness.   Negative: skin lesions abrasions contusions  Peripheral pulses: 2+ dorsalis pedis/posterior tibialis pulses bilaterally.  LE compartments are: Soft and nontender.  Gait pattern: Altered gait pattern due to left anterior thigh pain  Assistive devices: None  Neuro: Positive decreased sensation light touch as well as numbness and  dysesthesias into the left L3 dermatome. 5/5 motor strength in lower extremity. Negative nerve root tension signs in the lower extremity. Negative Babinski test, negative clonus. Symmetrical 1+ deep tendon reflexes at the knee absent at the Achilles.  Musculoskeletal: Moderate to severe low back pain with palpation and range of motion. Pain radiates predominantly into the left L3 dermatome. No significant hip, knee, ankle pain with isolated joint range of motion.  Imaging: X-rays of the lumbar spine: Demonstrate degenerative lumbar disc disease L5-S1 but no scoliosis or spondylolisthesis.   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 Severe right L5-S1 neuroforaminal narrowing and degenerative disc disease. Prior L4-5 hemilaminotomy is noted with no recurrent disc herniation. There is scar tissue and degenerative changes causing moderate foraminal stenosis. Slight anterior listhesis at L3-4 with mild foraminal stenosis.  Intervention: Patient had a recent L3 selective nerve root block on the left side and noted improvement in the left-sided low back and dysesthetic pains into the left thigh.  A/P: Summary: The patient has primary complaint of left anterior thigh pain consistent with L3 radiculopathy. Imaging studies show a L2-3 facet cyst causing L3 nerve compression in the lateral recess. Patient had significant temporary improvement with the L3 selective nerve root block.    At this point time despite appropriate conservative management his quality of life continues to be serious. He is interested in moving forward with surgery which I think is reasonable.    The goal of surgery is to reduce his left radicular L3 pain. Essentially, reproducing the results of the L3 selective nerve root block. While he does have other issues in his lumbar spine his principal complaint affecting his quality of life at this point time is the L3 radiculopathy.    I have gone over the surgery in great detail which would be an L2-3 decompression and excision of the left synovial cyst. Risks and benefits were reviewed.    Risks and benefits of lumbar decompression/discectomy: Infection, bleeding, death, stroke, paralysis, ongoing or worse pain, need for additional surgery, leak of spinal fluid, adjacent segment degeneration requiring additional surgery, post-operative hematoma formation that can result in neurological compromise and the need for urgent/emergent re-operation. Loss in bowel and bladder control. Injury to major vessels that could result in the need for urgent abdominal surgery to stop bleeding. Risk of deep venous  thrombosis (DVT) and the need for additional treatment. Recurrent disc herniation resulting in the need for revision surgery, which could include fusion surgery (utilizing instrumentation such as pedicle screws and intervertebral cages).  We will obtain preoperative medical clearance from his primary Care physician and move forward with surgery in a timely fashion. We did discuss his nicotine and diabetes will adversely affect his overall results and could potentially increase the risk of wound healing complications and infections. Patient has assured me that he is taking all appropriate steps to control his diabetes and curtail his nicotine use.

## 2022-06-14 NOTE — Discharge Instructions (Signed)
  Surgical Spinal Decompression, Care After Refer to this sheet in the next few weeks. These instructions provide you with information about caring for yourself after your procedure. Your health care provider may also give you more specific instructions. Your treatment has been planned according to current medical practices, but problems sometimes occur. Call your health care provider if you have any problems or questions after your procedure. What can I expect after the procedure? It is common to have pain for the first few days after the procedure. Some people continue to have mild pain even after making a full recovery. Follow these instructions at home: Medicine Take medicines only as directed by your health care provider. Avoid taking over-the-counter pain medicines unless your health care provider tells you otherwise. These medicines interfere with the development and growth of new bone cells. If you were prescribed a narcotic pain medicine, take it exactly as told by your health care provider. Do not drink alcohol while on the medicine. Do not drive while on the medicine. Injury care Care for your back brace as told by your health care provider. Brace should be worn at all times for the first 6 weeks except when sleeping, bathing, and sitting leisurely around the home. You CANNOT drive while wearing the brace, however, you may wear it as a passenger.  If directed, apply ice to the injured area: Put ice in a plastic bag. Place a towel between your skin and the bag. Leave the ice on for 20 minutes, 2-3 times a day. Activity Perform physical therapy exercises as told by your health care provider. Exercise regularly. Start by taking short walks. Slowly increase your activity level over time. Gentle exercise helps to ease pain. Sit, stand, walk, turn in bed, and reposition yourself as told by your health care provider. This will help to keep your spine in proper alignment. Avoid bending and  twisting your body. Avoid doing strenuous household chores, such as vacuuming. Do not lift anything that is heavier than 10 lb (4.5 kg). Other Instructions Keep all follow-up visits as directed by your health care provider. This is important. Do not use any tobacco products, including cigarettes, chewing tobacco, or electronic cigarettes. If you need help quitting, ask your health care provider. Nicotine affects the way bones heal. Contact a health care provider if: Your pain gets worse. You have a fever. You have redness, swelling, or pain at the site of your incision. You have fluid, blood, or pus coming from your incision. You have numbness, tingling, or weakness in any part of your body. Get help right away if: Your incision feels swollen and tender, and the surrounding area looks like a lump. The lump may be red or bluish in color. You cannot move any part of your body (paralysis). You cannot control your bladder or bowels. This information is not intended to replace advice given to you by your health care provider. Make sure you discuss any questions you have with your health care provider. 

## 2022-06-14 NOTE — Transfer of Care (Signed)
Immediate Anesthesia Transfer of Care Note  Patient: Lee Turner  Procedure(s) Performed: LUMBAR TWO TO THREE DECOMPRESSION AND REMOVAL OF CYST (Spine Lumbar)  Patient Location: PACU  Anesthesia Type:General  Level of Consciousness: awake, alert  and oriented  Airway & Oxygen Therapy: Patient Spontanous Breathing  Post-op Assessment: Report given to RN and Post -op Vital signs reviewed and stable  Post vital signs: Reviewed and stable  Last Vitals:  Vitals Value Taken Time  BP 141/77 06/14/22 0943  Temp    Pulse 86 06/14/22 0945  Resp 19 06/14/22 0945  SpO2 97 % 06/14/22 0945  Vitals shown include unvalidated device data.  Last Pain:  Vitals:   06/14/22 0615  PainSc: 5       Patients Stated Pain Goal: 2 (06/14/22 0615)  Complications: No notable events documented.

## 2022-06-14 NOTE — Op Note (Signed)
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 male who presents to my office with complaints of severe left anterior thigh pain.  Patient has multilevel degenerative changes, his pain became acutely worse over the last 6 months.  An MRI demonstrated a left-sided L2-3 synovial cyst causing marked compression of the L3 nerve root.  Patient had a left L3 selective nerve root block and noted temporary improvement in his pain and quality of life.  As a result we elected to move forward with the decompression of the cyst to address his severe left anterior thigh pain.  Appropriate risks benefits and alternatives were discussed with the patient and consent was obtained.  PROCEDURE DETAILS: Patient was brought into the operating room and was properly positioned on the operating room table.  After induction with general anesthesia the patient was endotracheally intubated.  A timeout was taken to confirm all important data: including patient, procedure, and the level. Teds, SCD's were applied.   Patient was turned prone onto the Wilson frame and all bony prominences were well-padded.  The back was prepped and draped in a standard fashion.  2 needles were placed in the back and an x-ray was taken to confirm localization for skin incision.  I marked out my skin incision and infiltrated with quarter percent Marcaine with epinephrine.  Midline incision was made and sharp dissection was carried out down to the deep fascia.  I incised the deep fascia and stripped the paraspinal muscles to expose the L2  and L3 spinous process as well as the L2 lamina and the L2-3 facet complex.  Self-retaining retractor was then placed and a Penfield 4 was placed underneath the L2 lamina.  An intraoperative x-ray was then taken and read by the radiologist and confirmed that we are at the L2-3 level.  The inferior third of the spinous process of L2 was removed and a central decompression was performed with a Kerrison rongeur.  I then remove the bulk of the left L2 lamina with a double-action Leksell rongeur.  I then removed the remaining portion with the 22mm Kerrison rongeur.  Medial facetectomy was performed with the 2 and 3 mm Kerrison rongeur.  I then gently dissected through the ligamentum flavum and began resecting this with the Kerrison rongeur.  This allowed me to gain access into the neural recess.  I swept the thecal sac and L3 nerve root medially and then completed the medial facetectomy.  Identified the medial border of the L3 pedicle confirming that my lateral recess decompression was satisfactory.  I then proceeded superiorly towards the L2-3 disc space.  As I remove the ligamentum flavum and medial aspect of the superior L3 facet I identified the synovial cyst.  The cyst was quite adherent to the thecal sac.  At this point I extended my laminotomy of L2 superiorly in order to get better visualization of the cyst.  At this point with the central and left lateral recess decompression complete I then began dissecting the cyst from the thecal sac.  Using a Penfield 4 I gently dissected and eventually freed  the cyst from the thecal sac.  I then resected using Kerrison rongeurs.  The cyst was sent to pathology for final evaluation.  Once the cyst was removed the thecal sac was completely decompressed as was the lateral recess.  Epidural veins were identified and coagulated with bipolar cautery.  Once I confirmed satisfactory decompression of the L3 nerve root and the thecal sac I then irrigated the wound copiously with  normal saline.  Once I confirmed hemostasis and then placed a thrombin-soaked Gelfoam patty over the laminotomy defect and then remove the retractors.  I then closed the deep fascia with interrupted #1 Vicryl sutures.  I then closed superficial fascia in a layered fashion with a running 0 Vicryl suture level and a 2-0 Vicryl suture level.  3-0 Monocryl was used to reapproximate the skin.  Steri-Strips and dry dressings were applied and the patient was ultimately extubated and transferred the PACU without incident.  The end of the case all needle sponge counts were correct there were no adverse intraoperative events.  Venita Lick, MD 06/14/2022 9:37 AM

## 2022-06-15 ENCOUNTER — Encounter (HOSPITAL_COMMUNITY): Payer: Self-pay | Admitting: Orthopedic Surgery

## 2022-06-15 ENCOUNTER — Other Ambulatory Visit (INDEPENDENT_AMBULATORY_CARE_PROVIDER_SITE_OTHER): Payer: Self-pay | Admitting: Family Medicine

## 2022-06-15 DIAGNOSIS — E1165 Type 2 diabetes mellitus with hyperglycemia: Secondary | ICD-10-CM

## 2022-06-15 LAB — SURGICAL PATHOLOGY

## 2022-08-01 ENCOUNTER — Encounter (INDEPENDENT_AMBULATORY_CARE_PROVIDER_SITE_OTHER): Payer: Self-pay

## 2023-07-13 IMAGING — CR DG LUMBAR SPINE 2-3V
2 series · 2 of 2 positions shown · non-contrast
Comparison: None Available.

CLINICAL DATA: L2-3 Decompression

EXAM:
LUMBAR SPINE - 1 VIEW

[lateral (1 of 2)]
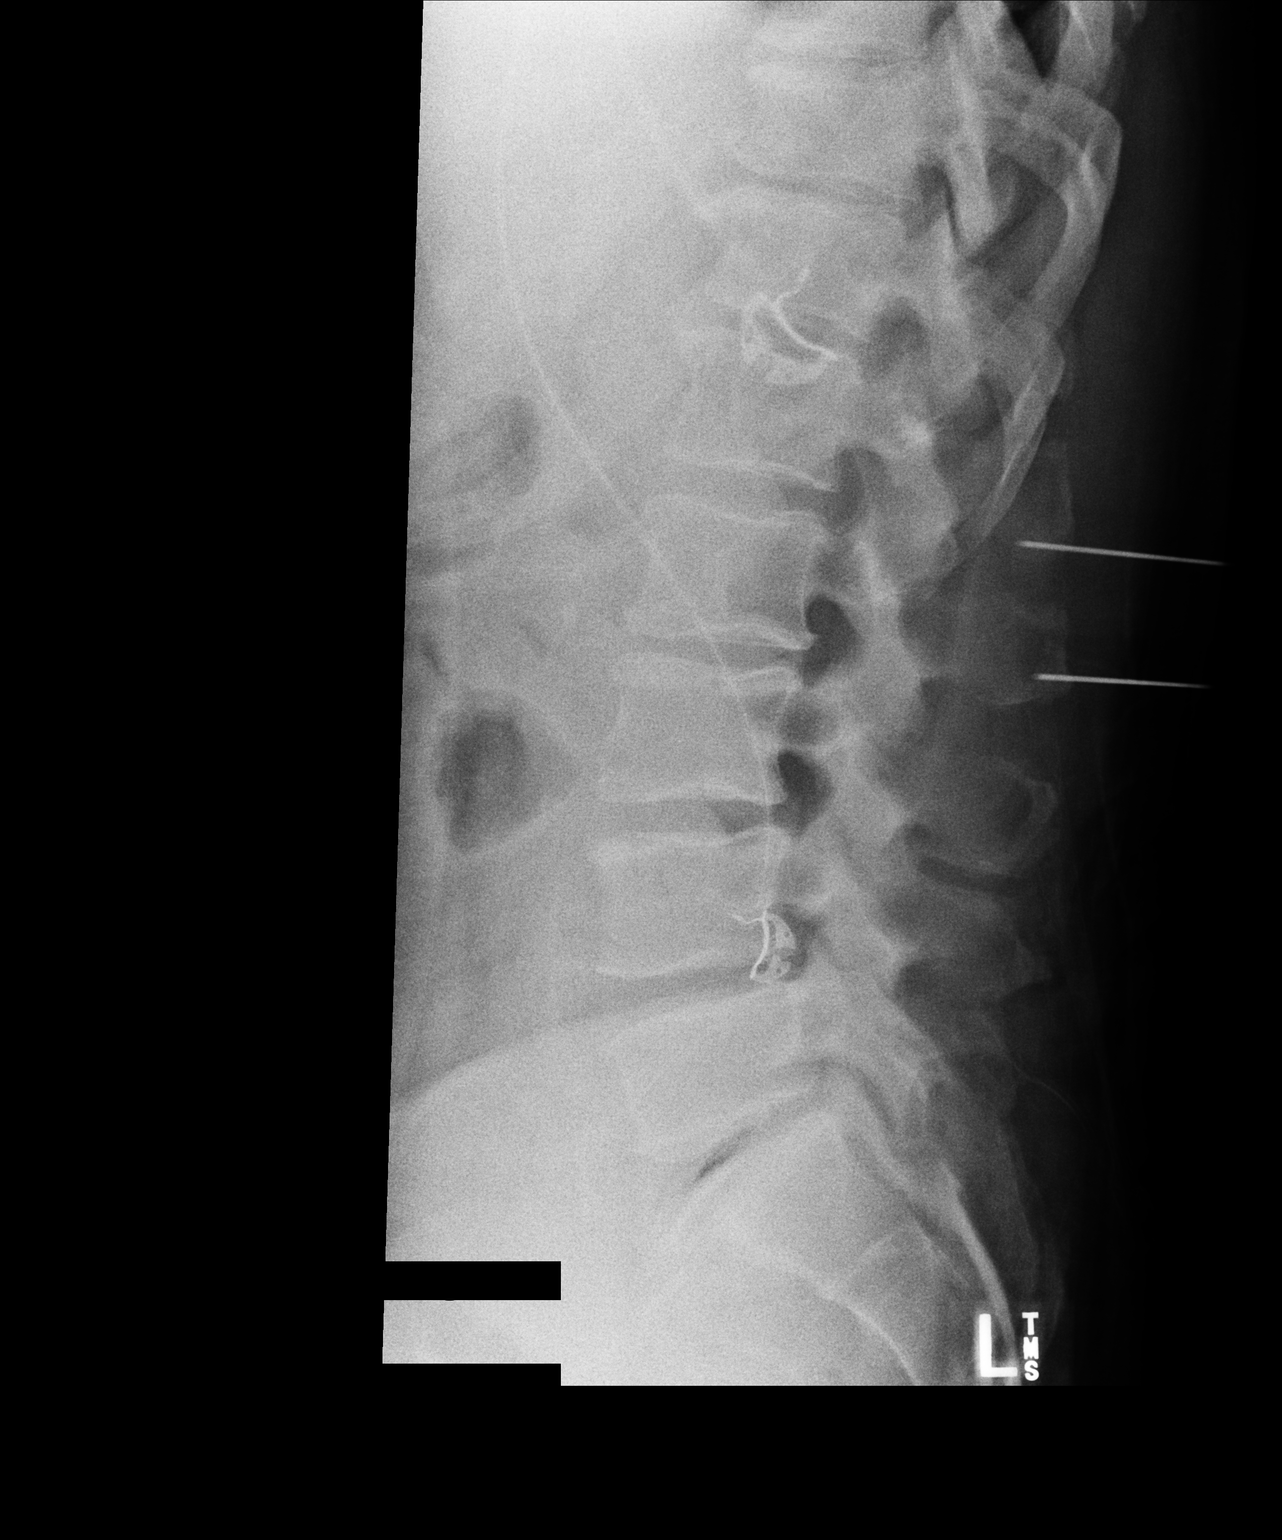

[lateral (2 of 2)]
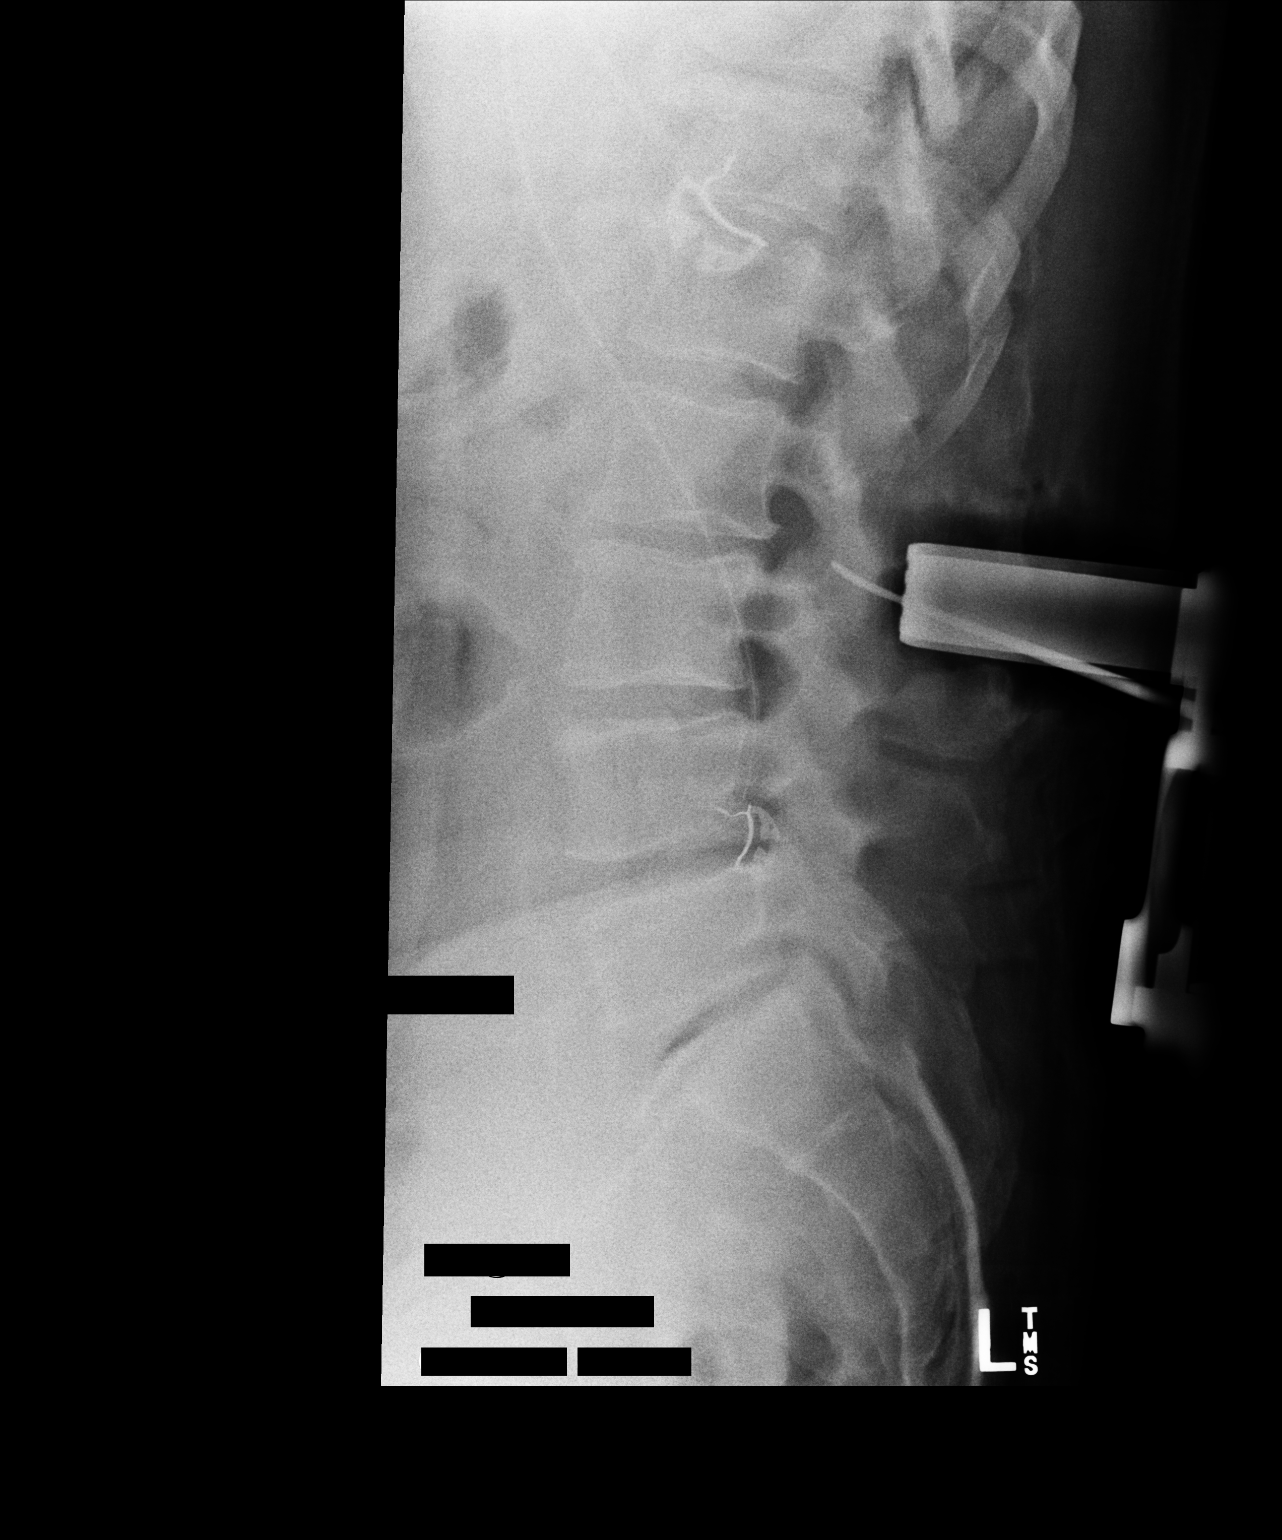

[2 of 2 positions shown; findings below may reference images not displayed]

FINDINGS: There is no evidence of lumbar spine fracture. Alignment is normal.
Intervertebral disc spaces are maintained.

Intraop images demonstrate localization instruments in the posterior
soft tissues. On image #2 instrument is at the level of the L2-L3
disc space.
IMPRESSION: On image #2 instrument is at the level of the L2-L3 disc space.
